# Patient Record
Sex: Female | Born: 1970 | Race: White | Hispanic: No | Marital: Married | Smoking: Never smoker
Health system: Southern US, Community
[De-identification: ages and names within clinical notes are randomized; demographics above are authoritative.]

## PROBLEM LIST (undated history)

## (undated) DIAGNOSIS — C4491 Basal cell carcinoma of skin, unspecified: Secondary | ICD-10-CM

## (undated) DIAGNOSIS — U071 COVID-19: Secondary | ICD-10-CM

## (undated) HISTORY — PX: ROTATOR CUFF REPAIR: SHX139

## (undated) HISTORY — DX: Basal cell carcinoma of skin, unspecified: C44.91

## (undated) HISTORY — DX: COVID-19: U07.1

---

## 2005-01-08 ENCOUNTER — Observation Stay: Payer: Self-pay | Admitting: Obstetrics & Gynecology

## 2005-02-01 ENCOUNTER — Inpatient Hospital Stay: Payer: Self-pay | Admitting: Obstetrics & Gynecology

## 2005-05-27 ENCOUNTER — Inpatient Hospital Stay: Payer: Self-pay | Admitting: Internal Medicine

## 2006-12-24 ENCOUNTER — Encounter: Payer: Self-pay | Admitting: Otolaryngology

## 2007-01-04 ENCOUNTER — Encounter: Payer: Self-pay | Admitting: Otolaryngology

## 2007-02-04 ENCOUNTER — Encounter: Payer: Self-pay | Admitting: Otolaryngology

## 2010-01-04 ENCOUNTER — Ambulatory Visit: Payer: Self-pay | Admitting: Obstetrics & Gynecology

## 2013-06-08 ENCOUNTER — Ambulatory Visit: Payer: Self-pay | Admitting: Obstetrics & Gynecology

## 2013-06-08 LAB — CBC
HCT: 30.1 % — ABNORMAL LOW (ref 35.0–47.0)
HGB: 10 g/dL — ABNORMAL LOW (ref 12.0–16.0)
MCH: 25.2 pg — ABNORMAL LOW (ref 26.0–34.0)
MCV: 76 fL — ABNORMAL LOW (ref 80–100)
Platelet: 510 10*3/uL — ABNORMAL HIGH (ref 150–440)
WBC: 6.2 10*3/uL (ref 3.6–11.0)

## 2013-06-08 LAB — PREGNANCY, URINE: Pregnancy Test, Urine: NEGATIVE m[IU]/mL

## 2013-06-11 ENCOUNTER — Ambulatory Visit: Payer: Self-pay | Admitting: Obstetrics & Gynecology

## 2013-06-11 HISTORY — PX: DILATION AND CURETTAGE OF UTERUS: SHX78

## 2013-06-12 LAB — PATHOLOGY REPORT

## 2014-02-21 ENCOUNTER — Observation Stay: Payer: Self-pay | Admitting: Obstetrics and Gynecology

## 2014-02-21 LAB — CBC WITH DIFFERENTIAL/PLATELET
BASOS PCT: 0.4 %
Basophil #: 0 10*3/uL (ref 0.0–0.1)
Eosinophil #: 0.2 10*3/uL (ref 0.0–0.7)
Eosinophil %: 1.8 %
HCT: 21.7 % — ABNORMAL LOW (ref 35.0–47.0)
HGB: 6.8 g/dL — AB (ref 12.0–16.0)
LYMPHS ABS: 2.9 10*3/uL (ref 1.0–3.6)
Lymphocyte %: 27.7 %
MCH: 25.6 pg — ABNORMAL LOW (ref 26.0–34.0)
MCHC: 31.4 g/dL — ABNORMAL LOW (ref 32.0–36.0)
MCV: 82 fL (ref 80–100)
Monocyte #: 0.7 x10 3/mm (ref 0.2–0.9)
Monocyte %: 6.6 %
NEUTROS PCT: 63.5 %
Neutrophil #: 6.6 10*3/uL — ABNORMAL HIGH (ref 1.4–6.5)
Platelet: 371 10*3/uL (ref 150–440)
RBC: 2.66 10*6/uL — AB (ref 3.80–5.20)
RDW: 17.5 % — AB (ref 11.5–14.5)
WBC: 10.4 10*3/uL (ref 3.6–11.0)

## 2014-02-21 LAB — URINALYSIS, COMPLETE
Bacteria: NONE SEEN
Bilirubin,UR: NEGATIVE
Glucose,UR: NEGATIVE mg/dL (ref 0–75)
Ketone: NEGATIVE
Leukocyte Esterase: NEGATIVE
Nitrite: NEGATIVE
PH: 5 (ref 4.5–8.0)
Specific Gravity: 1.015 (ref 1.003–1.030)
Squamous Epithelial: 1

## 2014-02-21 LAB — CBC
HCT: 16 % — AB (ref 35.0–47.0)
HGB: 5.1 g/dL — AB (ref 12.0–16.0)
MCH: 24.9 pg — AB (ref 26.0–34.0)
MCHC: 31.9 g/dL — AB (ref 32.0–36.0)
MCV: 78 fL — AB (ref 80–100)
Platelet: 424 10*3/uL (ref 150–440)
RBC: 2.05 10*6/uL — ABNORMAL LOW (ref 3.80–5.20)
RDW: 18.2 % — ABNORMAL HIGH (ref 11.5–14.5)
WBC: 11.8 10*3/uL — AB (ref 3.6–11.0)

## 2014-02-21 LAB — BASIC METABOLIC PANEL
Anion Gap: 8 (ref 7–16)
BUN: 10 mg/dL (ref 7–18)
CALCIUM: 8.5 mg/dL (ref 8.5–10.1)
CHLORIDE: 104 mmol/L (ref 98–107)
Co2: 24 mmol/L (ref 21–32)
Creatinine: 0.78 mg/dL (ref 0.60–1.30)
EGFR (African American): >60
EGFR (Non-African Amer.): >60
GLUCOSE: 114 mg/dL — AB (ref 65–99)
OSMOLALITY: 272 (ref 275–301)
POTASSIUM: 3.8 mmol/L (ref 3.5–5.1)
Sodium: 136 mmol/L (ref 136–145)

## 2014-02-21 LAB — PROTIME-INR
INR: 0.9
Prothrombin Time: 12.3 secs (ref 11.5–14.7)

## 2014-02-21 LAB — TSH: Thyroid Stimulating Horm: 3.72 u[IU]/mL

## 2014-02-21 LAB — HCG, QUANTITATIVE, PREGNANCY: Beta Hcg, Quant.: <1 m[IU]/mL — ABNORMAL LOW

## 2014-02-22 LAB — CBC WITH DIFFERENTIAL/PLATELET
BASOS PCT: 0.4 %
Basophil #: 0 10*3/uL (ref 0.0–0.1)
EOS ABS: 0.2 10*3/uL (ref 0.0–0.7)
EOS PCT: 2.5 %
HCT: 22.1 % — AB (ref 35.0–47.0)
HGB: 7.1 g/dL — AB (ref 12.0–16.0)
LYMPHS ABS: 2.8 10*3/uL (ref 1.0–3.6)
Lymphocyte %: 31.7 %
MCH: 25.7 pg — AB (ref 26.0–34.0)
MCHC: 32 g/dL (ref 32.0–36.0)
MCV: 80 fL (ref 80–100)
MONO ABS: 0.7 x10 3/mm (ref 0.2–0.9)
Monocyte %: 7.5 %
NEUTROS PCT: 57.9 %
Neutrophil #: 5.2 10*3/uL (ref 1.4–6.5)
Platelet: 342 10*3/uL (ref 150–440)
RBC: 2.75 10*6/uL — ABNORMAL LOW (ref 3.80–5.20)
RDW: 17.1 % — ABNORMAL HIGH (ref 11.5–14.5)
WBC: 8.9 10*3/uL (ref 3.6–11.0)

## 2014-02-24 HISTORY — PX: INTRAUTERINE DEVICE (IUD) INSERTION: SHX5877

## 2015-02-25 NOTE — Op Note (Signed)
PATIENT NAME:  Mary May, Mary May MR#:  [id number] DATE OF BIRTH:  Nov 13, 1970  DATE OF PROCEDURE:  06/11/2013  PREOPERATIVE DIAGNOSIS: Menorrhagia and abnormal uterine bleeding.   POSTOPERATIVE DIAGNOSIS: Menorrhagia and abnormal uterine bleeding.   PROCEDURE PERFORMED: Fractional dilatation and curettage.   SURGEON: Barnett Applebaum, M.D.   ANESTHESIA: General.   ESTIMATED BLOOD LOSS: Minimal.   COMPLICATIONS: None.   SPECIMEN: Endocervical curettage and endometrial curettage.   DISPOSITION: To the recovery room in stable condition.   TECHNIQUE: The patient is prepped and draped in the usual sterile fashion after adequate anesthesia is obtained in the dorsal lithotomy position. The bladder is drained with a Robinson catheter. Speculum is placed and the cervix is identified and grasped with a tenaculum. The endocervical curettage is performed with a Kevorkian curette. The cervix is then dilated to a size 20 Pratt dilator and the uterus is sounded to 10 cm. The uterus then has a curettage using a banjo curette with a specimen retrieved and sent to pathology for further review. Excellent hemostasis is noted. The tenaculum is removed. The patient is cleansed all blood and Betadine and goes to the recovery room in stable condition. All sponge, instrument, and needle counts are correct.   ____________________________ R. Barnett Applebaum, MD rph:aw D: 06/11/2013 11:38:53 ET T: 06/11/2013 11:48:44 ET JOB#: [id number]  cc: Glean Salen, MD, <Dictator> Gae Dry MD ELECTRONICALLY SIGNED 06/11/2013 19:02

## 2015-02-26 NOTE — Consult Note (Signed)
Consulting Service: Emergency Department Consulting Physician: Hinda Kehr MD History of Present Illness:  44 year old [phone] with long standing history of menorrhagia initially managed with D&C in 06/11/2013 with resumption of normal menses. This most recent episode of bleeding began 5 weeks, initially light flow about like a regular period for first 2 weeks, took 10 days of po porvera with brief cessation in bleeding, then resumption of heavier bleeding with clots about 1 super tampon every 1-1.5hrs.  She was evaluated by Dr. Kenton Kingfisher on 02/18/2014, transvaginal ultrasound at that time normal findings with and EMS of 32mm.  She was started on LoLoestrin, she is taking this one pill po daily (i.e. not tapper).  She started feeling light headed, dizzy on 02/19/14 but her daughter was hospitalized after undergoing ACL repair recently so she put off coming in.  patient states her menses have been increasing in flow for the past 2-3 years.  Prior to this she reports fairly light to moderate flow without passage of clots.  She did not have excessive bleeding following her deliveries, never required a blood transfusion, denies easy bruising or  easy bleeding during dental procedures.  She does not recall ever having her thyroid checked, she does report approximately 20lbs weight gain in the last year.  Denies constipation, skin or hair changes.  Review of Systems: 10 point of review of systems Past Medical History: 1) Menorrhagia 2) Obesity Past Surgical History: 1) C-section x 2 (2002 and 2006) 2) D&C x 2 (last 06/11/2013 [facility] benign pathology showing proliferative type endometrium) Past Obstetric History: [id number], SAB x 1, ectopic x 1TSVD 7lbs female1LTCS 7lbs 3oz femaleRLTCS 7lbs 3oz female Past Gynecologic History:  Menarche age 72, last pap 01/05/2012 negative for intraepithelial lesion and malignancy as well as HPV, GC & CT negative & negative. Family History: non-contributory Social History:  denies  tobacco, EtOH, illicit drug use Allergies: NKDA 1) Loloestrin FE 1 tab po once dailyb Physical Exam: T 97.50F; BP 102/51; HR 113; RR 18; O2sat 182% RANADnormocephalic, anicteric, pale conjunctivaCTABRRRNABS, soft, non-tender, non-distended, no rebound, no guardingFull exam deferred secondary to normal recent transvaginal ultrasound see below. normal external female genitalia, minimal amount of bleeding on chucks pad currently.  Not particularly brisk bleeding noted by referring ER phsysician.no edema, no erythema, no tendernessaffect full, mood appropriate Laboratory: BMP 02/21/2014 00:13: Na 136; K 3.8; Cl 104; CO2 24, BUN 10; Cr 0.78, BG 114BHCG 02/21/2014 00:13: <1and Screen 02/21/2014 00:13: O positive, antibody screen negative04/19/2015 00:35: Glucose negative, bilirubin negative, ketones, negative, specific gravity 1.015, blood 3+, pH 5.0, protein 30mg /dl, nitrite negative04/19/2015 00:13: PT 12.3, INR 0.904/19/2015 00:13: WBC 11.8K, H&H 5.1 & 16.0; Platelets 424K Imaging: Transvaginal Ultrasound (02/18/2014): recent transvaginal ultrasound at [facility] showing a normal size anteverted uterus with 51mm endometrial stripe with blood clot in the endometrial lining.  Right ovary not visualized, left ovary normal. Assessment: 44 year old [id number] with menorrhagia to anemia Plan: 1) Menorrhagia to anemia ? acute blood loss anemia with Hgb of 5.1 admit for transfusion of 2 units of packed red blood cells, repeat CBC 4-hr post transfusion.  Feel most likely underlying etiology given habitus and age is anovulatory cycle.  Long term management discussed with Dr. Kenton Kingfisher in clinic include hysterectomy, endometrial ablation, also brought up option of Mirena IUD - No recent baseline CBC - Will check TSH, prolactin - Medroxyprogesterone 20mg  po TID per ACOG Committee Opinion 553 Management of Acute Abnormal uterine bleeding.  We did discuss IV estrogen if unresponsive as  well as associated risk of DVT/VTE  with high dose estrogen therapy 2) FEN ? regular diet, D5  NS at 150L/hr 3) DVT ppx ? SCD?s until H&H improved and ambulatory 4) Dispositon ? pending cessation of uterine bleeding and improvement in H&H following transfusion   Electronic Signatures: Dorthula Nettles (MD)  (Signed on 19-Apr-15 03:09)  Authored  Last Updated: 19-Apr-15 03:09 by Dorthula Nettles (MD)

## 2016-10-02 ENCOUNTER — Other Ambulatory Visit: Payer: Self-pay | Admitting: Obstetrics & Gynecology

## 2016-10-02 DIAGNOSIS — Z1231 Encounter for screening mammogram for malignant neoplasm of breast: Secondary | ICD-10-CM

## 2016-10-12 ENCOUNTER — Ambulatory Visit
Admission: RE | Admit: 2016-10-12 | Discharge: 2016-10-12 | Disposition: A | Discharge status: Home / Self Care | Payer: Self-pay | Source: Ambulatory Visit | Attending: Obstetrics & Gynecology | Admitting: Obstetrics & Gynecology

## 2016-10-12 DIAGNOSIS — Z1231 Encounter for screening mammogram for malignant neoplasm of breast: Secondary | ICD-10-CM | POA: Insufficient documentation

## 2017-10-07 ENCOUNTER — Other Ambulatory Visit: Payer: Self-pay | Admitting: Obstetrics & Gynecology

## 2017-10-18 ENCOUNTER — Encounter: Payer: Self-pay | Admitting: Obstetrics and Gynecology

## 2017-10-18 ENCOUNTER — Ambulatory Visit (INDEPENDENT_AMBULATORY_CARE_PROVIDER_SITE_OTHER): Payer: Managed Care, Other (non HMO) | Admitting: Obstetrics and Gynecology

## 2017-10-18 VITALS — BP 134/92 | HR 69 | Ht 68.0 in | Wt 251.0 lb

## 2017-10-18 DIAGNOSIS — Z6838 Body mass index (BMI) 38.0-38.9, adult: Secondary | ICD-10-CM

## 2017-10-18 DIAGNOSIS — Z13220 Encounter for screening for lipoid disorders: Secondary | ICD-10-CM | POA: Diagnosis not present

## 2017-10-18 DIAGNOSIS — Z0000 Encounter for general adult medical examination without abnormal findings: Secondary | ICD-10-CM

## 2017-10-18 DIAGNOSIS — Z1239 Encounter for other screening for malignant neoplasm of breast: Secondary | ICD-10-CM

## 2017-10-18 DIAGNOSIS — Z1211 Encounter for screening for malignant neoplasm of colon: Secondary | ICD-10-CM | POA: Diagnosis not present

## 2017-10-18 DIAGNOSIS — Z01419 Encounter for gynecological examination (general) (routine) without abnormal findings: Secondary | ICD-10-CM | POA: Diagnosis not present

## 2017-10-18 DIAGNOSIS — Z1321 Encounter for screening for nutritional disorder: Secondary | ICD-10-CM | POA: Diagnosis not present

## 2017-10-18 DIAGNOSIS — Z1329 Encounter for screening for other suspected endocrine disorder: Secondary | ICD-10-CM

## 2017-10-18 DIAGNOSIS — Z1231 Encounter for screening mammogram for malignant neoplasm of breast: Secondary | ICD-10-CM | POA: Diagnosis not present

## 2017-10-18 NOTE — Progress Notes (Signed)
Gynecology Annual Exam  PCP: Patient, No Pcp Per  Chief Complaint:  Chief Complaint  Patient presents with  . Gynecologic Exam    History of Present Illness: Patient is a 46 y.o. [id number] presents for annual exam. The patient has no complaints today.   LMP: No LMP recorded. Patient is not currently having periods (Reason: IUD). Intermenstrual Bleeding: no Postcoital Bleeding: no Dysmenorrhea: no  IUD no bleeding, in place for 4 years  mammogram 2017 Birads 1 Pap 2017 NIL, hpv negative Declines colonoscopy Symptoms of menopause, sleep disturbances,   The patient is sexually active. She currently uses IUD for contraception. She denies dyspareunia.  The patient does not perform self breast exams.  There is no notable family history of breast or ovarian cancer in her family.  The patient wears seatbelts: yes.   The patient has regular exercise: yes.    The patient denies current symptoms of depression.    Review of Systems: Review of Systems  Constitutional: Negative for chills, fever, malaise/fatigue and weight loss.  HENT: Negative for congestion, hearing loss and sinus pain.   Eyes: Negative for blurred vision and double vision.  Respiratory: Negative for cough, sputum production, shortness of breath and wheezing.   Cardiovascular: Negative for chest pain, palpitations, orthopnea and leg swelling.  Gastrointestinal: Negative for abdominal pain, constipation, diarrhea, nausea and vomiting.  Genitourinary: Negative for dysuria, flank pain, frequency, hematuria and urgency.  Musculoskeletal: Negative for back pain, falls and joint pain.  Skin: Negative for itching and rash.  Neurological: Negative for dizziness and headaches.  Psychiatric/Behavioral: Negative for depression, substance abuse and suicidal ideas. The patient is not nervous/anxious.     Past Medical History:  Past Medical History:  Diagnosis Date  . Basal cell carcinoma    shoulder, hip, stomach, left arm,  back of right leg    Past Surgical History:  Past Surgical History:  Procedure Laterality Date  . CESAREAN SECTION    . DILATION AND CURETTAGE OF UTERUS  06/11/2013  . INTRAUTERINE DEVICE (IUD) INSERTION  02/24/2014   mirena  . ROTATOR CUFF REPAIR      Gynecologic History:  No LMP recorded. Patient is not currently having periods (Reason: IUD). Contraception: IUD Last Pap: Results were: 10/02/2016 NIL and HR HPV negative  Last mammogram: 10/12/2016  Results were: Gillian Shields I Obstetric History: [id number]  Family History:  Family History  Problem Relation Age of Onset  . Breast cancer Maternal Aunt   . Leukemia Father   . Brain cancer Paternal Aunt   . Lung cancer Paternal Aunt   . Bone cancer Paternal Aunt   . Colon cancer Paternal Uncle   . Prostate cancer Paternal Uncle   . Heart attack Paternal Uncle   . Colon cancer Maternal Grandfather     Social History:  Social History   Socioeconomic History  . Marital status: Married    Spouse name: Not on file  . Number of children: Not on file  . Years of education: Not on file  . Highest education level: Not on file  Social Needs  . Financial resource strain: Not on file  . Food insecurity - worry: Not on file  . Food insecurity - inability: Not on file  . Transportation needs - medical: Not on file  . Transportation needs - non-medical: Not on file  Occupational History  . Not on file  Tobacco Use  . Smoking status: Never Smoker  . Smokeless tobacco: Never Used  Substance and Sexual  Activity  . Alcohol use: No    Frequency: Never  . Drug use: No  . Sexual activity: Yes    Birth control/protection: IUD  Other Topics Concern  . Not on file  Social History Narrative  . Not on file    Allergies:  No Known Allergies  Medications: Prior to Admission medications   Medication Sig Start Date End Date Taking? Authorizing Provider  levonorgestrel (MIRENA) 20 MCG/24HR IUD 1 each by Intrauterine route once.   Yes  [provider]    Physical Exam Vitals: Blood pressure (!) 134/92, pulse 69, height 5\' 8"  (1.727 m), weight 251 lb (113.9 kg).  Physical Exam  Constitutional: She is oriented to person, place, and time. She appears well-developed.  Genitourinary: Vagina normal and uterus normal. There is no lesion on the right labia. There is no lesion on the left labia. Vagina exhibits no lesion. Right adnexum does not display mass. Left adnexum does not display mass. Cervix does not exhibit motion tenderness.  HENT:  Head: Normocephalic and atraumatic.  Eyes: EOM are normal.  Neck: Neck supple. No thyromegaly present.  Cardiovascular: Normal rate, regular rhythm and normal heart sounds.  Pulmonary/Chest: Effort normal and breath sounds normal. Right breast exhibits no inverted nipple, no mass, no nipple discharge and no skin change. Left breast exhibits no inverted nipple, no mass, no nipple discharge and no skin change.  Abdominal: Soft. Bowel sounds are normal. She exhibits no distension and no mass.  Neurological: She is alert and oriented to person, place, and time.  Skin: Skin is warm and dry.  Psychiatric: She has a normal mood and affect. Her behavior is normal. Judgment and thought content normal.  Vitals reviewed.  Female chaperone present for pelvic and breast  portions of the physical exam  Assessment: 46 y.o. [id number] routine annual exam  Plan: Problem List Items Addressed This Visit    None    Visit Diagnoses    Health care maintenance    -  Primary   Relevant Orders   CBC With Differential   Comprehensive metabolic panel   32-DJMEQASTMHDQQI D Lcms D2+D3   Screening for breast cancer       Relevant Orders   MM DIGITAL SCREENING BILATERAL   BMI 38.0-38.9,adult       Screening for colon cancer       Screening cholesterol level       Relevant Orders   Lipid panel   Screening for thyroid disorder       Relevant Orders   TSH   Encounter for vitamin deficiency screening        Relevant Orders   25-Hydroxyvitamin D Lcms D2+D3      1) Mammogram - recommend yearly screening mammogram.  Mammogram Was ordered today  2) STI screening was offered and declined  3) ASCCP guidelines and rational discussed.  Patient opts for every 5 years screening interval  4) Contraception - continue with IUD  5) Colonoscopy -- Screening recommended starting at age 57 for average risk individuals, age 23 for individuals deemed at increased risk (including African Americans) and recommended to continue until age 68.  For patient age 35-85 individualized approach is recommended.  Gold standard screening is via colonoscopy, Cologuard screening is an acceptable alternative for patient unwilling or unable to undergo colonoscopy.  "Colorectal cancer screening for average?risk adults: 2018 guideline update from the American Cancer Society"[place]: A Cancer Journal for Clinicians: Apr 03, 2017   6) Routine healthcare maintenance including cholesterol, diabetes  screening discussed. Ordered today.

## 2017-10-22 LAB — COMPREHENSIVE METABOLIC PANEL
A/G RATIO: 2.1 (ref 1.2–2.2)
ALT: 13 IU/L (ref 0–32)
AST: 17 IU/L (ref 0–40)
Albumin: 4.6 g/dL (ref 3.5–5.5)
Alkaline Phosphatase: 91 IU/L (ref 39–117)
BUN/Creatinine Ratio: 15 (ref 9–23)
BUN: 11 mg/dL (ref 6–24)
Bilirubin Total: 0.4 mg/dL (ref 0.0–1.2)
CALCIUM: 9.4 mg/dL (ref 8.7–10.2)
CO2: 24 mmol/L (ref 20–29)
CREATININE: 0.75 mg/dL (ref 0.57–1.00)
Chloride: 101 mmol/L (ref 96–106)
GFR calc Af Amer: 109 mL/min/{1.73_m2} (ref >59)
GFR, EST NON AFRICAN AMERICAN: 95 mL/min/{1.73_m2} (ref >59)
GLOBULIN, TOTAL: 2.2 g/dL (ref 1.5–4.5)
Glucose: 92 mg/dL (ref 65–99)
Potassium: 4.4 mmol/L (ref 3.5–5.2)
Sodium: 137 mmol/L (ref 134–144)
TOTAL PROTEIN: 6.8 g/dL (ref 6.0–8.5)

## 2017-10-22 LAB — CBC WITH DIFFERENTIAL
Basophils Absolute: 0 10*3/uL (ref 0.0–0.2)
Basos: 0 %
EOS (ABSOLUTE): 0.2 10*3/uL (ref 0.0–0.4)
EOS: 3 %
HEMATOCRIT: 43.4 % (ref 34.0–46.6)
Hemoglobin: 14.4 g/dL (ref 11.1–15.9)
Immature Grans (Abs): 0 10*3/uL (ref 0.0–0.1)
Immature Granulocytes: 0 %
LYMPHS: 30 %
Lymphocytes Absolute: 2.2 10*3/uL (ref 0.7–3.1)
MCH: 29.3 pg (ref 26.6–33.0)
MCHC: 33.2 g/dL (ref 31.5–35.7)
MCV: 88 fL (ref 79–97)
MONOCYTES: 9 %
MONOS ABS: 0.6 10*3/uL (ref 0.1–0.9)
Neutrophils Absolute: 4.3 10*3/uL (ref 1.4–7.0)
Neutrophils: 58 %
RBC: 4.91 x10E6/uL (ref 3.77–5.28)
RDW: 13.5 % (ref 12.3–15.4)
WBC: 7.3 10*3/uL (ref 3.4–10.8)

## 2017-10-22 LAB — LIPID PANEL
Chol/HDL Ratio: 4.3 ratio (ref 0.0–4.4)
Cholesterol, Total: 220 mg/dL — ABNORMAL HIGH (ref 100–199)
HDL: 51 mg/dL (ref >39)
LDL CALC: 150 mg/dL — AB (ref 0–99)
TRIGLYCERIDES: 95 mg/dL (ref 0–149)
VLDL Cholesterol Cal: 19 mg/dL (ref 5–40)

## 2017-10-22 LAB — 25-HYDROXY VITAMIN D LCMS D2+D3
25-Hydroxy, Vitamin D-2: <1 ng/mL
25-Hydroxy, Vitamin D-3: 23 ng/mL
25-Hydroxy, Vitamin D: 24 ng/mL — ABNORMAL LOW

## 2017-10-22 LAB — TSH: TSH: 3.58 u[IU]/mL (ref 0.450–4.500)

## 2017-10-22 NOTE — Progress Notes (Signed)
Discussed with patient on phone recommended vitamin D supplementation and seeing her primary care doctor to manage cholesterol.

## 2017-12-04 ENCOUNTER — Ambulatory Visit
Admission: RE | Admit: 2017-12-04 | Discharge: 2017-12-04 | Disposition: A | Discharge status: Home / Self Care | Payer: Managed Care, Other (non HMO) | Source: Ambulatory Visit | Attending: Obstetrics and Gynecology | Admitting: Obstetrics and Gynecology

## 2017-12-04 DIAGNOSIS — Z1239 Encounter for other screening for malignant neoplasm of breast: Secondary | ICD-10-CM

## 2017-12-04 DIAGNOSIS — Z1231 Encounter for screening mammogram for malignant neoplasm of breast: Secondary | ICD-10-CM | POA: Insufficient documentation

## 2017-12-09 NOTE — Progress Notes (Signed)
Called and left message to check Mychart.

## 2017-12-10 DIAGNOSIS — M25561 Pain in right knee: Secondary | ICD-10-CM | POA: Insufficient documentation

## 2018-11-13 ENCOUNTER — Ambulatory Visit (INDEPENDENT_AMBULATORY_CARE_PROVIDER_SITE_OTHER): Payer: 59 | Admitting: Obstetrics & Gynecology

## 2018-11-13 ENCOUNTER — Encounter: Payer: Self-pay | Admitting: Obstetrics & Gynecology

## 2018-11-13 VITALS — BP 120/80 | Ht 68.0 in | Wt 231.0 lb

## 2018-11-13 DIAGNOSIS — Z1239 Encounter for other screening for malignant neoplasm of breast: Secondary | ICD-10-CM

## 2018-11-13 DIAGNOSIS — Z01419 Encounter for gynecological examination (general) (routine) without abnormal findings: Secondary | ICD-10-CM

## 2018-11-13 DIAGNOSIS — E559 Vitamin D deficiency, unspecified: Secondary | ICD-10-CM

## 2018-11-13 NOTE — Patient Instructions (Addendum)
PAP every three years Mammogram every year    Call [phone] to schedule at [facility] Colonoscopy every 10 years after age 50 Labs up to date  IUD Exchange soon    Take Ibuprofen 800 mg prior to exam

## 2018-11-13 NOTE — Progress Notes (Signed)
HPI:      Mary May is a 48 y.o. [id number] who LMP was No LMP recorded. (Menstrual status: IUD)., she presents today for her annual examination. The patient has no complaints today. The patient is sexually active. Her last pap: approximate date 2017 and was normal and last mammogram: approximate date 2018 and was normal. The patient does perform self breast exams.  There is no notable family history of breast or ovarian cancer in her family.  The patient has regular exercise: yes.  The patient denies current symptoms of depression.    GYN History: Contraception: IUD  PMHx: Past Medical History:  Diagnosis Date  . Basal cell carcinoma    shoulder, hip, stomach, left arm, back of right leg   Past Surgical History:  Procedure Laterality Date  . CESAREAN SECTION    . DILATION AND CURETTAGE OF UTERUS  06/11/2013  . INTRAUTERINE DEVICE (IUD) INSERTION  02/24/2014   mirena  . ROTATOR CUFF REPAIR     Family History  Problem Relation Age of Onset  . Breast cancer Maternal Aunt   . Leukemia Father   . Brain cancer Paternal Aunt   . Lung cancer Paternal Aunt   . Bone cancer Paternal Aunt   . Colon cancer Paternal Uncle   . Prostate cancer Paternal Uncle   . Heart attack Paternal Uncle   . Colon cancer Maternal Grandfather    Social History   Tobacco Use  . Smoking status: Never Smoker  . Smokeless tobacco: Never Used  Substance Use Topics  . Alcohol use: No    Frequency: Never  . Drug use: No    Current Outpatient Medications:  .  levonorgestrel (MIRENA) 20 MCG/24HR IUD, 1 each by Intrauterine route once., Disp: , Rfl:  Allergies: Patient has no known allergies.  Review of Systems  Constitutional: Negative for chills, fever and malaise/fatigue.  HENT: Negative for congestion, sinus pain and sore throat.   Eyes: Negative for blurred vision and pain.  Respiratory: Negative for cough and wheezing.   Cardiovascular: Negative for chest pain and leg swelling.    Gastrointestinal: Negative for abdominal pain, constipation, diarrhea, heartburn, nausea and vomiting.  Genitourinary: Negative for dysuria, frequency, hematuria and urgency.  Musculoskeletal: Negative for back pain, joint pain, myalgias and neck pain.  Skin: Negative for itching and rash.  Neurological: Negative for dizziness, tremors and weakness.  Endo/Heme/Allergies: Does not bruise/bleed easily.  Psychiatric/Behavioral: Negative for depression. The patient is not nervous/anxious and does not have insomnia.     Objective: BP 120/80   Ht 5\' 8"  (1.727 m)   Wt 231 lb (104.8 kg)   BMI 35.12 kg/m   Filed Weights   11/13/18 1522  Weight: 231 lb (104.8 kg)   Body mass index is 35.12 kg/m. Physical Exam Constitutional:      General: She is not in acute distress.    Appearance: She is well-developed.  Genitourinary:     Pelvic exam was performed with patient supine.     Vagina, uterus and rectum normal.     No lesions in the vagina.     No vaginal bleeding.     No cervical motion tenderness, friability, lesion or polyp.     Uterus is mobile.     Uterus is not enlarged.     No uterine mass detected.    Uterus is midaxial.     No right or left adnexal mass present.     Right adnexa not tender.  Left adnexa not tender.     Genitourinary Comments: IUD strings 2 cm  HENT:     Head: Normocephalic and atraumatic. No laceration.     Right Ear: Hearing normal.     Left Ear: Hearing normal.     Mouth/Throat:     Pharynx: Uvula midline.  Eyes:     Pupils: Pupils are equal, round, and reactive to light.  Neck:     Musculoskeletal: Normal range of motion and neck supple.     Thyroid: No thyromegaly.  Cardiovascular:     Rate and Rhythm: Normal rate and regular rhythm.     Heart sounds: No murmur. No friction rub. No gallop.   Pulmonary:     Effort: Pulmonary effort is normal. No respiratory distress.     Breath sounds: Normal breath sounds. No wheezing.  Chest:     Breasts:         Right: No mass, skin change or tenderness.        Left: No mass, skin change or tenderness.  Abdominal:     General: Bowel sounds are normal. There is no distension.     Palpations: Abdomen is soft.     Tenderness: There is no abdominal tenderness. There is no rebound.  Musculoskeletal: Normal range of motion.  Neurological:     Mental Status: She is alert and oriented to person, place, and time.     Cranial Nerves: No cranial nerve deficit.  Skin:    General: Skin is warm and dry.  Psychiatric:        Judgment: Judgment normal.  Vitals signs reviewed.     Assessment:  ANNUAL EXAM 1. Screening for breast cancer   2. Women's annual routine gynecological examination   3. Vitamin D deficiency    Screening Plan:            1.  Cervical Screening-  Pap smear schedule reviewed with patient, due 2021  2. Breast screening- Exam annually and mammogram>40 planned   3. Colonoscopy every 10 years, Hemoccult testing - after age 3, due next year  4. Labs Ordered today  5. Counseling for contraception: IUD  IUD exchange soon Counseled on pros and cons of IUD at 46; as she has done well and has no bleeding and is concerned about irreg bleeding and perimenopausal sx's, will cont w IUD another 5 year cycle  6. Vitamin D deficiency - VITAMIN D 25 Hydroxy (Vit-D Deficiency, Fractures)      F/U  Return in about 2 weeks (around 11/27/2018) for Follow up for IUD exchange anytime pt desires.  Barnett Applebaum, MD, [facility], [facility] 11/13/2018  3:58 PM

## 2018-11-14 LAB — VITAMIN D 25 HYDROXY (VIT D DEFICIENCY, FRACTURES): Vit D, 25-Hydroxy: 22.9 ng/mL — ABNORMAL LOW (ref 30.0–100.0)

## 2018-11-25 ENCOUNTER — Encounter: Payer: Self-pay | Admitting: Obstetrics & Gynecology

## 2018-11-25 ENCOUNTER — Ambulatory Visit (INDEPENDENT_AMBULATORY_CARE_PROVIDER_SITE_OTHER): Payer: 59 | Admitting: Obstetrics & Gynecology

## 2018-11-25 VITALS — BP 120/80 | Ht 68.5 in | Wt 232.0 lb

## 2018-11-25 DIAGNOSIS — Z30433 Encounter for removal and reinsertion of intrauterine contraceptive device: Secondary | ICD-10-CM

## 2018-11-25 NOTE — Progress Notes (Signed)
  History of Present Illness:  Mary May is a 48 y.o. that had a Mirena IUD placed approximately 5 years ago. Since that time, she states that she does well w no periods and great contraception.  The following portions of the patient's history were reviewed and updated as appropriate: allergies, current medications, past family history, past medical history, past social history, past surgical history and problem list.  There are no active problems to display for this patient.  Medications:  Current Outpatient Medications on File Prior to Visit  Medication Sig Dispense Refill  . levonorgestrel (MIRENA) 20 MCG/24HR IUD 1 each by Intrauterine route once.     No current facility-administered medications on file prior to visit.    Allergies: has No Known Allergies.  Physical Exam:  BP 120/80   Ht 5' 8.5" (1.74 m)   Wt 232 lb (105.2 kg)   BMI 34.76 kg/m  Body mass index is 34.76 kg/m. Constitutional: Well nourished, well developed female in no acute distress.  Abdomen: diffusely non tender to palpation, non distended, and no masses, hernias Neuro: Grossly intact Psych:  Normal mood and affect.    Pelvic exam:  Two IUD strings present seen coming from the cervical os. EGBUS, vaginal vault and cervix: within normal limits  IUD Removal Strings of IUD identified and grasped.  IUD removed without problem.  Pt tolerated this well.  IUD noted to be intact.  IUD Re-Insertion PROCEDURE NOTE:  Mary May is a 48 y.o. [id number] here for IUD insertion. No GYN concerns.  Last pap smear was normal.  IUD Insertion Procedure Note Patient identified, informed consent performed, consent signed.   Discussed risks of irregular bleeding, cramping, infection, malpositioning or misplacement of the IUD outside the uterus which may require further procedure such as laparoscopy, risk of failure <1%. Time out was performed.  Urine pregnancy test negative.  A bimanual exam showed the uterus to be  anteverted.  Speculum placed in the vagina.  Cervix visualized.  Cleaned with Betadine x 2.  Grasped anteriorly with a single tooth tenaculum.  Uterus sounded to 7 cm.   IUD placed per manufacturer's recommendations.  Strings trimmed to 3 cm. Tenaculum was removed, good hemostasis noted.  Patient tolerated procedure well.   Patient was given post-procedure instructions.  She was advised to have backup contraception for one week.  Patient was also asked to check IUD strings periodically and follow up in 4 weeks for IUD check.  Barnett Applebaum, MD, [facility], [facility] 11/25/2018  10:39 AM

## 2018-11-25 NOTE — Patient Instructions (Signed)
Intrauterine Device Insertion, Care After    This sheet gives you information about how to care for yourself after your procedure. Your health care provider may also give you more specific instructions. If you have problems or questions, contact your health care provider.  What can I expect after the procedure?  After the procedure, it is common to have:  · Cramps and pain in the abdomen.  · Light bleeding (spotting) or heavier bleeding that is like your menstrual period. This may last for up to a few days.  · Lower back pain.  · Dizziness.  · Headaches.  · Nausea.  Follow these instructions at home:  · Before resuming sexual activity, check to make sure that you can feel the IUD string(s). You should be able to feel the end of the string(s) below the opening of your cervix. If your IUD string is in place, you may resume sexual activity.  ? If you had a hormonal IUD inserted more than 7 days after your most recent period started, you will need to use a backup method of birth control for 7 days after IUD insertion. Ask your health care provider whether this applies to you.  · Continue to check that the IUD is still in place by feeling for the string(s) after every menstrual period, or once a month.  · Take over-the-counter and prescription medicines only as told by your health care provider.  · Do not drive or use heavy machinery while taking prescription pain medicine.  · Keep all follow-up visits as told by your health care provider. This is important.  Contact a health care provider if:  · You have bleeding that is heavier or lasts longer than a normal menstrual cycle.  · You have a fever.  · You have cramps or abdominal pain that get worse or do not get better with medicine.  · You develop abdominal pain that is new or is not in the same area of earlier cramping and pain.  · You feel lightheaded or weak.  · You have abnormal or bad-smelling discharge from your vagina.  · You have pain during sexual  activity.  · You have any of the following problems with your IUD string(s):  ? The string bothers or hurts you or your sexual partner.  ? You cannot feel the string.  ? The string has gotten longer.  · You can feel the IUD in your vagina.  · You think you may be pregnant, or you miss your menstrual period.  · You think you may have an STI (sexually transmitted infection).  Get help right away if:  · You have flu-like symptoms.  · You have a fever and chills.  · You can feel that your IUD has slipped out of place.  Summary  · After the procedure, it is common to have cramps and pain in the abdomen. It is also common to have light bleeding (spotting) or heavier bleeding that is like your menstrual period.  · Continue to check that the IUD is still in place by feeling for the string(s) after every menstrual period, or once a month.  · Keep all follow-up visits as told by your health care provider. This is important.  · Contact your health care provider if you have problems with your IUD string(s), such as the string getting longer or bothering you or your sexual partner.  This information is not intended to replace advice given to you by your health care provider. Make   sure you discuss any questions you have with your health care provider.  Document Released: 06/20/2011 Document Revised: 09/12/2016 Document Reviewed: 09/12/2016  Elsevier Interactive Patient Education © 2019 [employer]

## 2018-12-24 ENCOUNTER — Ambulatory Visit (INDEPENDENT_AMBULATORY_CARE_PROVIDER_SITE_OTHER): Payer: 59 | Admitting: Obstetrics & Gynecology

## 2018-12-24 ENCOUNTER — Encounter: Payer: Self-pay | Admitting: Obstetrics & Gynecology

## 2018-12-24 VITALS — BP 130/80 | Ht 69.0 in | Wt 232.0 lb

## 2018-12-24 DIAGNOSIS — E669 Obesity, unspecified: Secondary | ICD-10-CM | POA: Diagnosis not present

## 2018-12-24 DIAGNOSIS — Z30431 Encounter for routine checking of intrauterine contraceptive device: Secondary | ICD-10-CM

## 2018-12-24 MED ORDER — PHENTERMINE HCL 37.5 MG PO TABS: ORAL_TABLET | ORAL | 0 refills | Status: DC

## 2018-12-24 MED ORDER — CYANOCOBALAMIN 1000 MCG/ML IJ SOLN: 1000.0000 ug | INTRAMUSCULAR | 1 refills | Status: DC

## 2018-12-24 NOTE — Progress Notes (Signed)
  History of Present Illness:  Mary May is a 48 y.o. that had a Mirena IUD placed approximately 4 weeks ago. Since that time, she states that she has had no bleeding and pain  Also concerned for weight gain, unable to lose despite daily exercise and attempts at diet.  Has been on meds in past for obesity.  PMHx: She  has a past medical history of Basal cell carcinoma. Also,  has a past surgical history that includes Cesarean section; Dilation and curettage of uterus (06/11/2013); Intrauterine device (iud) insertion (02/24/2014); and Rotator cuff repair., family history includes Bone cancer in her paternal aunt; Brain cancer in her paternal aunt; Breast cancer in her maternal aunt; Colon cancer in her maternal grandfather and paternal uncle; Heart attack in her paternal uncle; Leukemia in her father; Lung cancer in her paternal aunt; Prostate cancer in her paternal uncle.,  reports that she has never smoked. She has never used smokeless tobacco. She reports that she does not drink alcohol or use drugs. No outpatient medications have been marked as taking for the 12/24/18 encounter (Office Visit) with Gae Dry, MD.  .  Also, has No Known Allergies..  Review of Systems  All other systems reviewed and are negative.  Physical Exam:  BP 130/80   Ht 5\' 9"  (1.753 m)   Wt 232 lb (105.2 kg)   BMI 34.26 kg/m  Body mass index is 34.26 kg/m. Constitutional: Well nourished, well developed female in no acute distress.  Abdomen: diffusely non tender to palpation, non distended, and no masses, hernias Neuro: Grossly intact Psych:  Normal mood and affect.    Pelvic exam:  Two IUD strings present seen coming from the cervical os. EGBUS, vaginal vault and cervix: within normal limits  Assessment: IUD strings present in proper location; pt doing well  Plan: She was told to continue to use barrier contraception, in order to prevent any STIs, and to take a home pregnancy test or call us if she  ever thinks she may be pregnant, and that her IUD expires in 5 years.  She was amenable to this plan and we will see her back in 1 year/PRN.  Weight loss medicine discussed, side effects.  Rx given w one month follow up.  A total of 15 minutes were spent face-to-face with the patient during this encounter and over half of that time dealt with counseling and coordination of care.  Barnett Applebaum, MD, [facility], [facility] 12/24/2018  4:25 PM

## 2019-01-20 ENCOUNTER — Other Ambulatory Visit: Payer: Self-pay

## 2019-01-20 ENCOUNTER — Ambulatory Visit
Admission: RE | Admit: 2019-01-20 | Discharge: 2019-01-20 | Disposition: A | Discharge status: Home / Self Care | Payer: 59 | Source: Ambulatory Visit | Attending: Obstetrics & Gynecology | Admitting: Obstetrics & Gynecology

## 2019-01-20 DIAGNOSIS — Z1231 Encounter for screening mammogram for malignant neoplasm of breast: Secondary | ICD-10-CM | POA: Insufficient documentation

## 2019-01-20 DIAGNOSIS — Z1239 Encounter for other screening for malignant neoplasm of breast: Secondary | ICD-10-CM

## 2019-01-21 ENCOUNTER — Other Ambulatory Visit: Payer: Self-pay | Admitting: Obstetrics & Gynecology

## 2019-01-21 ENCOUNTER — Encounter: Payer: Self-pay | Admitting: Obstetrics & Gynecology

## 2019-01-21 ENCOUNTER — Ambulatory Visit (INDEPENDENT_AMBULATORY_CARE_PROVIDER_SITE_OTHER): Payer: 59 | Admitting: Obstetrics & Gynecology

## 2019-01-21 VITALS — BP 120/80 | Ht 69.0 in | Wt 223.0 lb

## 2019-01-21 DIAGNOSIS — Z6832 Body mass index (BMI) 32.0-32.9, adult: Secondary | ICD-10-CM | POA: Diagnosis not present

## 2019-01-21 DIAGNOSIS — N6320 Unspecified lump in the left breast, unspecified quadrant: Secondary | ICD-10-CM

## 2019-01-21 DIAGNOSIS — E669 Obesity, unspecified: Secondary | ICD-10-CM | POA: Diagnosis not present

## 2019-01-21 DIAGNOSIS — R928 Other abnormal and inconclusive findings on diagnostic imaging of breast: Secondary | ICD-10-CM

## 2019-01-21 MED ORDER — PHENTERMINE HCL 37.5 MG PO TABS: ORAL_TABLET | ORAL | 1 refills | Status: DC

## 2019-01-21 NOTE — Progress Notes (Addendum)
  History of Present Illness:  Mary May is a 48 y.o. who was started on Phentermine approximately 1 month ago due to obesity/abnormal weight gain. The patient has lost 9 pounds over the past month due to meds and other lifestyle changes.  She had lost 25 lbs prior based on lifestyle changes but this has plateued prior to last month..   She has these side effects: dry mouth.  PMHx: She  has a past medical history of Basal cell carcinoma. Also,  has a past surgical history that includes Cesarean section; Dilation and curettage of uterus (06/11/2013); Intrauterine device (iud) insertion (02/24/2014); and Rotator cuff repair., family history includes Bone cancer in her paternal aunt; Brain cancer in her paternal aunt; Breast cancer in her maternal aunt; Colon cancer in her maternal grandfather and paternal uncle; Heart attack in her paternal uncle; Leukemia in her father; Lung cancer in her paternal aunt; Prostate cancer in her paternal uncle.,  reports that she has never smoked. She has never used smokeless tobacco. She reports that she does not drink alcohol or use drugs.  She has a current medication list which includes the following prescription(s): cyanocobalamin, levonorgestrel, and phentermine. Also, has No Known Allergies.  Review of Systems  All other systems reviewed and are negative.   Physical Exam:  BP 120/80   Ht 5\' 9"  (1.753 m)   Wt 223 lb (101.2 kg)   BMI 32.93 kg/m  Body mass index is 32.93 kg/m. Filed Weights   01/21/19 1557  Weight: 223 lb (101.2 kg)    Physical Exam Constitutional:      General: She is not in acute distress.    Appearance: She is well-developed.  Musculoskeletal: Normal range of motion.  Neurological:     Mental Status: She is alert and oriented to person, place, and time.  Skin:    General: Skin is warm and dry.  Vitals signs reviewed.     Assessment: obesity Medication treatment is going well for her.  Plan: Patient is continued/added  to prescription appetite suppressants: Phentermine and self-directed dieting.   Will continue to assist patient in incorporating positive experiences into her life to promote a positive mental attitude.  Education given regarding appropriate lifestyle changes for weight loss, including regular physical activity, healthy coping strategies, caloric restriction, and healthy eating patterns.  The risks and benefits as well as side effects of medication, such as Phenteramine or Tenuate, is discussed.  The pros and cons of suppressing appetite and boosting metabolism is counseled.  Risks of tolerance and addiction discussed.  Use of medicine will be short term.  Pt to call with any negative side effects and agrees to keep follow up appointments.  A total of 15 minutes were spent face-to-face with the patient during this encounter and over half of that time dealt with counseling and coordination of care.  Barnett Applebaum, MD, [facility], [facility] 01/21/2019  3:59 PM

## 2019-01-22 NOTE — Addendum Note (Signed)
Addended by: Gae Dry on: 01/22/2019 06:28 PM   Modules accepted: Level of Service

## 2019-01-28 ENCOUNTER — Ambulatory Visit
Admission: RE | Admit: 2019-01-28 | Discharge: 2019-01-28 | Disposition: A | Discharge status: Home / Self Care | Payer: 59 | Source: Ambulatory Visit | Attending: Obstetrics & Gynecology | Admitting: Obstetrics & Gynecology

## 2019-01-28 ENCOUNTER — Other Ambulatory Visit: Payer: Self-pay

## 2019-01-28 ENCOUNTER — Other Ambulatory Visit: Payer: Self-pay | Admitting: Obstetrics & Gynecology

## 2019-01-28 DIAGNOSIS — R928 Other abnormal and inconclusive findings on diagnostic imaging of breast: Secondary | ICD-10-CM | POA: Insufficient documentation

## 2019-01-28 DIAGNOSIS — N6320 Unspecified lump in the left breast, unspecified quadrant: Secondary | ICD-10-CM | POA: Insufficient documentation

## 2019-02-02 ENCOUNTER — Other Ambulatory Visit: Payer: Self-pay

## 2019-02-02 ENCOUNTER — Ambulatory Visit
Admission: RE | Admit: 2019-02-02 | Discharge: 2019-02-02 | Disposition: A | Discharge status: Home / Self Care | Payer: 59 | Source: Ambulatory Visit | Attending: Obstetrics & Gynecology | Admitting: Obstetrics & Gynecology

## 2019-02-02 DIAGNOSIS — R928 Other abnormal and inconclusive findings on diagnostic imaging of breast: Secondary | ICD-10-CM

## 2019-02-02 DIAGNOSIS — N6320 Unspecified lump in the left breast, unspecified quadrant: Secondary | ICD-10-CM

## 2019-02-02 HISTORY — PX: BREAST BIOPSY: SHX20

## 2019-02-03 LAB — SURGICAL PATHOLOGY

## 2019-03-23 ENCOUNTER — Other Ambulatory Visit: Payer: Self-pay | Admitting: Obstetrics & Gynecology

## 2019-03-23 ENCOUNTER — Ambulatory Visit (INDEPENDENT_AMBULATORY_CARE_PROVIDER_SITE_OTHER): Payer: 59 | Admitting: Obstetrics & Gynecology

## 2019-03-23 ENCOUNTER — Encounter: Payer: Self-pay | Admitting: Obstetrics & Gynecology

## 2019-03-23 ENCOUNTER — Other Ambulatory Visit: Payer: Self-pay

## 2019-03-23 VITALS — BP 135/85 | Ht 68.0 in | Wt 212.0 lb

## 2019-03-23 DIAGNOSIS — Z6832 Body mass index (BMI) 32.0-32.9, adult: Secondary | ICD-10-CM

## 2019-03-23 DIAGNOSIS — E669 Obesity, unspecified: Secondary | ICD-10-CM | POA: Diagnosis not present

## 2019-03-23 MED ORDER — PHENTERMINE HCL 37.5 MG PO TABS: ORAL_TABLET | ORAL | 1 refills | Status: DC

## 2019-03-23 NOTE — Progress Notes (Signed)
Virtual Visit via Telephone Note  I connected with Mary May on 03/23/19 at  4:10 PM EDT by telephone and verified that I am speaking with the correct person using two identifiers.   I discussed the limitations, risks, security and privacy concerns of performing an evaluation and management service by telephone and the availability of in person appointments. I also discussed with the patient that there may be a patient responsible charge related to this service. The patient expressed understanding and agreed to proceed.  She was at home and I was in my office.  History of Present Illness:  Mary May is a 48 y.o. who was started on  Current Outpatient Medications on File Prior to Visit  Medication Sig Dispense Refill  . phentermine (ADIPEX-P) 37.5 MG tablet One tablet po in morning. 30 tablet 1  . cyanocobalamin (,VITAMIN B-12,) 1000 MCG/ML injection Inject 1 mL (1,000 mcg total) into the muscle every 30 (thirty) days. (Patient not taking: Reported on 03/23/2019) 10 mL 1  approximately 3 months ago due to obesity/abnormal weight gain. The patient has lost 11 pounds over the past 2 mos due to meds and exercise..   She has these side effects: none.  PMHx: She  has a past medical history of Basal cell carcinoma. Also,  has a past surgical history that includes Cesarean section; Dilation and curettage of uterus (06/11/2013); Intrauterine device (iud) insertion (02/24/2014); Rotator cuff repair; and Breast biopsy (Left, 02/02/2019)., family history includes Bone cancer in her paternal aunt; Brain cancer in her paternal aunt; Breast cancer in her maternal aunt; Colon cancer in her maternal grandfather and paternal uncle; Heart attack in her paternal uncle; Leukemia in her father; Lung cancer in her paternal aunt; Prostate cancer in her paternal uncle.,  reports that she has never smoked. She has never used smokeless tobacco. She reports that she does not drink alcohol or use drugs.  She has a  current medication list which includes the following prescription(s): levonorgestrel, phentermine, and cyanocobalamin. Also, has No Known Allergies.  Review of Systems  All other systems reviewed and are negative.  Observations/Objective: No exam today, due to telephone eVisit due to [facility] virus restriction on elective visits and procedures.  Prior visits reviewed along with ultrasounds/labs as indicated. BP 135/85   Ht 5\' 8"  (1.727 m)   Wt 212 lb (96.2 kg)   BMI 32.23 kg/m  (reported)  Assessment and Plan: 1. Obesity (BMI 30.0-34.9) Medication treatment is going well for her.  Plan: Patient is continued/added to prescription appetite suppressants: Phentermine, self-directed dieting and increasing exercise.   Will continue to assist patient in incorporating positive experiences into her life to promote a positive mental attitude.  Education given regarding appropriate lifestyle changes for weight loss, including regular physical activity, healthy coping strategies, caloric restriction, and healthy eating patterns.  The risks and benefits as well as side effects of medication, such as Phenteramine or Tenuate, is discussed.  The pros and cons of suppressing appetite and boosting metabolism is counseled.  Risks of tolerance and addiction discussed.  Use of medicine will be short term.  Pt to call with any negative side effects and agrees to keep follow up appointments.   Follow Up Instructions: 2 months   I discussed the assessment and treatment plan with the patient. The patient was provided an opportunity to ask questions and all were answered. The patient agreed with the plan and demonstrated an understanding of the instructions.   The patient was advised to  call back or seek an in-person evaluation if the symptoms worsen or if the condition fails to improve as anticipated.  I provided 7 minutes of non-face-to-face time during this encounter.   Hoyt Koch, MD

## 2019-05-26 ENCOUNTER — Ambulatory Visit: Payer: 59 | Admitting: Obstetrics & Gynecology

## 2019-05-27 ENCOUNTER — Encounter: Payer: Self-pay | Admitting: Obstetrics & Gynecology

## 2019-05-27 ENCOUNTER — Ambulatory Visit (INDEPENDENT_AMBULATORY_CARE_PROVIDER_SITE_OTHER): Payer: 59 | Admitting: Obstetrics & Gynecology

## 2019-05-27 ENCOUNTER — Telehealth: Payer: Self-pay | Admitting: Obstetrics & Gynecology

## 2019-05-27 ENCOUNTER — Other Ambulatory Visit: Payer: Self-pay

## 2019-05-27 VITALS — Ht 68.0 in | Wt 213.0 lb

## 2019-05-27 DIAGNOSIS — Z6832 Body mass index (BMI) 32.0-32.9, adult: Secondary | ICD-10-CM | POA: Diagnosis not present

## 2019-05-27 DIAGNOSIS — E669 Obesity, unspecified: Secondary | ICD-10-CM | POA: Diagnosis not present

## 2019-05-27 MED ORDER — DIETHYLPROPION HCL 25 MG PO TABS: 1.0000 | ORAL_TABLET | Freq: Every morning | ORAL | 1 refills | Status: DC

## 2019-05-27 NOTE — Telephone Encounter (Signed)
-----   Message from Gae Dry, MD sent at 05/27/2019 11:34 AM EDT ----- Regarding: f/u 2 mos

## 2019-05-27 NOTE — Progress Notes (Signed)
Virtual Visit via Telephone Note  I connected with Mary May on 05/27/19 at 11:20 AM EDT by telephone and verified that I am speaking with the correct person using two identifiers.   I discussed the limitations, risks, security and privacy concerns of performing an evaluation and management service by telephone and the availability of in person appointments. I also discussed with the patient that there may be a patient responsible charge related to this service. The patient expressed understanding and agreed to proceed. She was at home and I was in my office.  History of Present Illness:   Mary May is a 48 y.o. who was started on Phentermine approximately 4 months ago due to obesity/abnormal weight gain. The patient reports no significant weight change..   She has these side effects: none.  PMHx: She  has a past medical history of Basal cell carcinoma. Also,  has a past surgical history that includes Cesarean section; Dilation and curettage of uterus (06/11/2013); Intrauterine device (iud) insertion (02/24/2014); Rotator cuff repair; and Breast biopsy (Left, 02/02/2019)., family history includes Bone cancer in her paternal aunt; Brain cancer in her paternal aunt; Breast cancer in her maternal aunt; Colon cancer in her maternal grandfather and paternal uncle; Heart attack in her paternal uncle; Leukemia in her father; Lung cancer in her paternal aunt; Prostate cancer in her paternal uncle.,  reports that she has never smoked. She has never used smokeless tobacco. She reports that she does not drink alcohol or use drugs.  She has a current medication list which includes the following prescription(s): cyanocobalamin, levonorgestrel, and diethylpropion hcl. Also, has No Known Allergies.  Review of Systems  All other systems reviewed and are negative.   Observations/Objective: No exam today, due to telephone eVisit due to [facility] virus restriction on elective visits and procedures.  Prior  visits reviewed along with ultrasounds/labs as indicated. Ht 5\' 8"  (1.727 m)   Wt 213 lb (96.6 kg)   BMI 32.39 kg/m  (reported)  Assessment and Plan: 1. Obesity (BMI 30.0-34.9) - change in therapy - Diethylpropion HCl 25 MG TABS; Take 1 tablet (25 mg total) by mouth every morning.  Dispense: 30 tablet; Refill: 1  Will continue to assist patient in incorporating positive experiences into her life to promote a positive mental attitude.  Education given regarding appropriate lifestyle changes for weight loss, including regular physical activity, healthy coping strategies, caloric restriction, and healthy eating patterns.  The risks and benefits as well as side effects of medication, such as Phenteramine or Tenuate, is discussed.  The pros and cons of suppressing appetite and boosting metabolism is counseled.  Risks of tolerance and addiction discussed.  Use of medicine will be short term.  Pt to call with any negative side effects and agrees to keep follow up appointments.  Follow Up Instructions: 2 mos   I discussed the assessment and treatment plan with the patient. The patient was provided an opportunity to ask questions and all were answered. The patient agreed with the plan and demonstrated an understanding of the instructions.   The patient was advised to call back or seek an in-person evaluation if the symptoms worsen or if the condition fails to improve as anticipated.  I provided 11 minutes of non-face-to-face time during this encounter.   Hoyt Koch, MD

## 2019-05-27 NOTE — Telephone Encounter (Signed)
Called and left voice mail for patient to call back to be schedule °

## 2020-01-05 ENCOUNTER — Other Ambulatory Visit: Payer: Self-pay | Admitting: Obstetrics & Gynecology

## 2020-02-02 ENCOUNTER — Encounter: Payer: Self-pay | Admitting: Obstetrics & Gynecology

## 2020-02-02 ENCOUNTER — Other Ambulatory Visit: Payer: Self-pay

## 2020-02-02 ENCOUNTER — Ambulatory Visit (INDEPENDENT_AMBULATORY_CARE_PROVIDER_SITE_OTHER): Payer: 59 | Admitting: Obstetrics & Gynecology

## 2020-02-02 ENCOUNTER — Other Ambulatory Visit (HOSPITAL_COMMUNITY)
Admission: RE | Admit: 2020-02-02 | Discharge: 2020-02-02 | Disposition: A | Discharge status: Home / Self Care | Payer: 59 | Source: Ambulatory Visit | Attending: Obstetrics & Gynecology | Admitting: Obstetrics & Gynecology

## 2020-02-02 VITALS — BP 120/82 | Ht 68.5 in | Wt 224.0 lb

## 2020-02-02 DIAGNOSIS — E669 Obesity, unspecified: Secondary | ICD-10-CM

## 2020-02-02 DIAGNOSIS — E7800 Pure hypercholesterolemia, unspecified: Secondary | ICD-10-CM | POA: Diagnosis not present

## 2020-02-02 DIAGNOSIS — Z713 Dietary counseling and surveillance: Secondary | ICD-10-CM

## 2020-02-02 DIAGNOSIS — E559 Vitamin D deficiency, unspecified: Secondary | ICD-10-CM | POA: Diagnosis not present

## 2020-02-02 DIAGNOSIS — Z1211 Encounter for screening for malignant neoplasm of colon: Secondary | ICD-10-CM

## 2020-02-02 DIAGNOSIS — Z1329 Encounter for screening for other suspected endocrine disorder: Secondary | ICD-10-CM

## 2020-02-02 DIAGNOSIS — Z124 Encounter for screening for malignant neoplasm of cervix: Secondary | ICD-10-CM | POA: Insufficient documentation

## 2020-02-02 DIAGNOSIS — Z6833 Body mass index (BMI) 33.0-33.9, adult: Secondary | ICD-10-CM

## 2020-02-02 DIAGNOSIS — Z131 Encounter for screening for diabetes mellitus: Secondary | ICD-10-CM

## 2020-02-02 DIAGNOSIS — Z1231 Encounter for screening mammogram for malignant neoplasm of breast: Secondary | ICD-10-CM

## 2020-02-02 DIAGNOSIS — Z01419 Encounter for gynecological examination (general) (routine) without abnormal findings: Secondary | ICD-10-CM | POA: Diagnosis not present

## 2020-02-02 MED ORDER — PHENTERMINE HCL 37.5 MG PO TABS: ORAL_TABLET | ORAL | 0 refills | Status: DC

## 2020-02-02 NOTE — Progress Notes (Signed)
HPI:      [name] is a 49 y.o. [phone] who LMP was No LMP recorded. (Menstrual status: IUD)., she presents today for her annual examination. The patient has no complaints today. The patient is sexually active. Her last pap: approximate date 2017 and was normal and last mammogram: approximate date 2020 and was abnormal: left SCNB neg after abn mass seen on MMG. The patient does perform self breast exams.  There is notable family history of breast or ovarian cancer in her family.  The patient has regular exercise: yes.  The patient denies current symptoms of depression.    GYN History: Contraception: IUD  PMHx: Past Medical History:  Diagnosis Date  . Basal cell carcinoma    shoulder, hip, stomach, left arm, back of right leg  . COVID-19    Past Surgical History:  Procedure Laterality Date  . BREAST BIOPSY Left 02/02/2019   pending path, [place] bx   . CESAREAN SECTION    . DILATION AND CURETTAGE OF UTERUS  06/11/2013  . INTRAUTERINE DEVICE (IUD) INSERTION  02/24/2014   mirena  . ROTATOR CUFF REPAIR     Family History  Problem Relation Age of Onset  . Breast cancer Maternal Aunt   . Leukemia Father   . Brain cancer Paternal Aunt   . Lung cancer Paternal Aunt   . Bone cancer Paternal Aunt   . Colon cancer Paternal Uncle   . Prostate cancer Paternal Uncle   . Heart attack Paternal Uncle   . Colon cancer Maternal Grandfather    Social History   Tobacco Use  . Smoking status: Never Smoker  . Smokeless tobacco: Never Used  Substance Use Topics  . Alcohol use: No  . Drug use: No    Current Outpatient Medications:  .  levonorgestrel (MIRENA) 20 MCG/24HR IUD, 1 each by Intrauterine route once., Disp: , Rfl:  .  phentermine (ADIPEX-P) 37.5 MG tablet, One tablet po in morning., Disp: 30 tablet, Rfl: 0 Allergies: Patient has no known allergies.  Review of Systems  Constitutional: Negative for chills, fever and malaise/fatigue.  HENT: Negative for congestion, sinus  pain and sore throat.   Eyes: Negative for blurred vision and pain.  Respiratory: Negative for cough and wheezing.   Cardiovascular: Negative for chest pain and leg swelling.  Gastrointestinal: Negative for abdominal pain, constipation, diarrhea, heartburn, nausea and vomiting.  Genitourinary: Negative for dysuria, frequency, hematuria and urgency.  Musculoskeletal: Negative for back pain, joint pain, myalgias and neck pain.  Skin: Negative for itching and rash.  Neurological: Negative for dizziness, tremors and weakness.  Endo/Heme/Allergies: Does not bruise/bleed easily.  Psychiatric/Behavioral: Negative for depression. The patient is not nervous/anxious and does not have insomnia.     Objective: BP 120/82   Ht 5' 8.5" (1.74 m)   Wt 224 lb (101.6 kg)   BMI 33.56 kg/m   Filed Weights   02/02/20 1344  Weight: 224 lb (101.6 kg)   Body mass index is 33.56 kg/m. Physical Exam Constitutional:      General: She is not in acute distress.    Appearance: She is well-developed.  Genitourinary:     Pelvic exam was performed with patient supine.     Vagina, uterus and rectum normal.     No lesions in the vagina.     No vaginal bleeding.     No cervical motion tenderness, friability, lesion or polyp.     Uterus is mobile.     Uterus is  not enlarged.     No uterine mass detected.    Uterus is midaxial.     No right or left adnexal mass present.     Right adnexa not tender.     Left adnexa not tender.  HENT:     Head: Normocephalic and atraumatic. No laceration.     Right Ear: Hearing normal.     Left Ear: Hearing normal.     Mouth/Throat:     Pharynx: Uvula midline.  Eyes:     Pupils: Pupils are equal, round, and reactive to light.  Neck:     Thyroid: No thyromegaly.  Cardiovascular:     Rate and Rhythm: Normal rate and regular rhythm.     Heart sounds: No murmur. No friction rub. No gallop.   Pulmonary:     Effort: Pulmonary effort is normal. No respiratory distress.      Breath sounds: Normal breath sounds. No wheezing.  Chest:     Breasts:        Right: No mass, skin change or tenderness.        Left: No mass, skin change or tenderness.  Abdominal:     General: Bowel sounds are normal. There is no distension.     Palpations: Abdomen is soft.     Tenderness: There is no abdominal tenderness. There is no rebound.  Musculoskeletal:        General: Normal range of motion.     Cervical back: Normal range of motion and neck supple.  Neurological:     Mental Status: She is alert and oriented to person, place, and time.     Cranial Nerves: No cranial nerve deficit.  Skin:    General: Skin is warm and dry.  Psychiatric:        Judgment: Judgment normal.  Vitals reviewed.     Assessment:  ANNUAL EXAM 1. Women's annual routine gynecological examination   2. Screening for cervical cancer   3. Encounter for screening mammogram for malignant neoplasm of breast   4. Screen for colon cancer   5. Obesity (BMI 30.0-34.9)   6. Hypercholesteremia   7. Vitamin D deficiency   8. Screening for thyroid disorder   9. Screening for diabetes mellitus      Screening Plan:            1.  Cervical Screening-  Pap smear done today  2. Breast screening- Exam annually and mammogram>40 planned   3. Colonoscopy every 10 years, Hemoccult testing - after age 38  4. Labs To return fasting at a later date  5. Counseling for contraception: IUD  Year 1   6. Obesity (BMI 30.0-34.9) - Weight loss counseled - Gained some weight while sick from Covid in January - phentermine (ADIPEX-P) 37.5 MG tablet; One tablet po in morning.  Dispense: 30 tablet; Refill: 0    F/U  Return in about 4 weeks (around 03/01/2020) for Follow up appt on a WED am (for fasting labs and medicine f/u).  Barnett Applebaum, MD, [facility], [facility] 02/02/2020  2:16 PM

## 2020-02-02 NOTE — Patient Instructions (Addendum)
PAP every three years Mammogram every year    Call [phone] to schedule at [facility] Colonoscopy every 10 years Labs soon  Phentermine tablets or capsules What is this medicine? PHENTERMINE (FEN ter meen) decreases your appetite. It is used with a reduced calorie diet and exercise to help you lose weight. This medicine may be used for other purposes; ask your health care provider or pharmacist if you have questions. COMMON BRAND NAME(S): Adipex-P, Atti-Plex P, Atti-Plex P Spansule, Fastin, Lomaira, Pro-Fast, Tara-8 What should I tell my health care provider before I take this medicine? They need to know if you have any of these conditions:  agitation or nervousness  diabetes  glaucoma  heart disease  high blood pressure  history of drug abuse or addiction  history of stroke  kidney disease  lung disease called Primary Pulmonary Hypertension (PPH)  taken an MAOI like Carbex, Eldepryl, Marplan, Nardil, or Parnate in last 14 days  taking stimulant medicines for attention disorders, weight loss, or to stay awake  thyroid disease  an unusual or allergic reaction to phentermine, other medicines, foods, dyes, or preservatives  pregnant or trying to get pregnant  breast-feeding How should I use this medicine? Take this medicine by mouth with a glass of water. Follow the directions on the prescription label. Take your medicine at regular intervals. Do not take it more often than directed. Do not stop taking except on your doctor's advice. Talk to your pediatrician regarding the use of this medicine in children. While this drug may be prescribed for children 17 years or older for selected conditions, precautions do apply. Overdosage: If you think you have taken too much of this medicine contact a poison control center or emergency room at once. NOTE: This medicine is only for you. Do not share this medicine with others. What if I miss a dose? If you miss a dose, take it as  soon as you can. If it is almost time for your next dose, take only that dose. Do not take double or extra doses. What may interact with this medicine? Do not take this medicine with any of the following medications:  MAOIs like Carbex, Eldepryl, Marplan, Nardil, and Parnate This medicine may also interact with the following medications:  alcohol  certain medicines for depression, anxiety, or psychotic disorders  certain medicines for high blood pressure  linezolid  medicines for colds or breathing difficulties like pseudoephedrine or phenylephrine  medicines for diabetes  sibutramine  stimulant medicines for attention disorders, weight loss, or to stay awake This list may not describe all possible interactions. Give your health care provider a list of all the medicines, herbs, non-prescription drugs, or dietary supplements you use. Also tell them if you smoke, drink alcohol, or use illegal drugs. Some items may interact with your medicine. What should I watch for while using this medicine? Visit your doctor or health care provider for regular checks on your progress. Do not stop taking except on your health care provider's advice. You may develop a severe reaction. Your health care provider will tell you how much medicine to take. Do not take this medicine close to bedtime. It may prevent you from sleeping. You may get drowsy or dizzy. Do not drive, use machinery, or do anything that needs mental alertness until you know how this medicine affects you. Do not stand or sit up quickly, especially if you are an older patient. This reduces the risk of dizzy or fainting spells. Alcohol may increase dizziness and  drowsiness. Avoid alcoholic drinks. This medicine may affect blood sugar levels. Ask your healthcare provider if changes in diet or medicines are needed if you have diabetes. Women should inform their health care provider if they wish to become pregnant or think they might be  pregnant. Losing weight while pregnant is not advised and may cause harm to the unborn child. Talk to your health care provider for more information. What side effects may I notice from receiving this medicine? Side effects that you should report to your doctor or health care professional as soon as possible:  allergic reactions like skin rash, itching or hives, swelling of the face, lips, or tongue  breathing problems  changes in emotions or moods  changes in vision  chest pain or chest tightness  fast, irregular heartbeat  feeling faint or lightheaded  increased blood pressure  irritable  restlessness  tremors  seizures  signs and symptoms of a stroke like changes in vision; confusion; trouble speaking or understanding; severe headaches; sudden numbness or weakness of the face, arm or leg; trouble walking; dizziness; loss of balance or coordination  unusually weak or tired Side effects that usually do not require medical attention (report to your doctor or health care professional if they continue or are bothersome):  changes in taste  constipation or diarrhea  dizziness  dry mouth  headache  trouble sleeping  upset stomach This list may not describe all possible side effects. Call your doctor for medical advice about side effects. You may report side effects to FDA at 1-800-FDA-1088. Where should I keep my medicine? Keep out of the reach of children. This medicine can be abused. Keep your medicine in a safe place to protect it from theft. Do not share this medicine with anyone. Selling or giving away this medicine is dangerous and against the law. This medicine may cause harm and death if it is taken by other adults, children, or pets. Return medicine that has not been used to an official disposal site. Contact the DEA at 336-282-2760 or your city/county government to find a site. If you cannot return the medicine, mix any unused medicine with a substance like  cat litter or coffee grounds. Then throw the medicine away in a sealed container like a sealed bag or coffee can with a lid. Do not use the medicine after the expiration date. Store at room temperature between 20 and 25 degrees C (68 and 77 degrees F). Keep container tightly closed. NOTE: This sheet is a summary. It may not cover all possible information. If you have questions about this medicine, talk to your doctor, pharmacist, or health care provider.  2020 Elsevier/Gold Standard (2019-08-28 12:54:20)

## 2020-02-04 LAB — CYTOLOGY - PAP
Comment: NEGATIVE
Diagnosis: NEGATIVE
High risk HPV: NEGATIVE

## 2020-02-11 ENCOUNTER — Encounter: Payer: Self-pay | Admitting: *Deleted

## 2020-02-18 ENCOUNTER — Ambulatory Visit (INDEPENDENT_AMBULATORY_CARE_PROVIDER_SITE_OTHER): Payer: Self-pay | Admitting: Gastroenterology

## 2020-02-18 ENCOUNTER — Other Ambulatory Visit: Payer: Self-pay

## 2020-02-18 DIAGNOSIS — Z1211 Encounter for screening for malignant neoplasm of colon: Secondary | ICD-10-CM

## 2020-02-18 NOTE — Progress Notes (Signed)
Gastroenterology Pre-Procedure Review  Request Date: Friday 04/22/20 Requesting Physician: Dr. Vicente Males  PATIENT REVIEW QUESTIONS: The patient responded to the following health history questions as indicated:    1. Are you having any GI issues? no 2. Do you have a personal history of Polyps? no 3. Do you have a family history of Colon Cancer or Polyps? yes (Father colon polyps) 4. Diabetes Mellitus? no 5. Joint replacements in the past 12 months?no 6. Major health problems in the past 3 months?no 7. Any artificial heart valves, MVP, or defibrillator?no    MEDICATIONS & ALLERGIES:    Patient reports the following regarding taking any anticoagulation/antiplatelet therapy:   Plavix, Coumadin, Eliquis, Xarelto, Lovenox, Pradaxa, Brilinta, or Effient? no Aspirin? no  Patient confirms/reports the following medications:  Current Outpatient Medications  Medication Sig Dispense Refill  . levonorgestrel (MIRENA) 20 MCG/24HR IUD 1 each by Intrauterine route once.    . phentermine (ADIPEX-P) 37.5 MG tablet One tablet po in morning. 30 tablet 0   No current facility-administered medications for this visit.    Patient confirms/reports the following allergies:  Allergies  Allergen Reactions  . No Known Allergies     No orders of the defined types were placed in this encounter.   AUTHORIZATION INFORMATION Primary Insurance: 1D#: Group #:  Secondary Insurance: 1D#: Group #:  SCHEDULE INFORMATION: Date: Friday 04/22/20 Time: Location:[facility]

## 2020-03-02 ENCOUNTER — Other Ambulatory Visit: Payer: Self-pay

## 2020-03-02 ENCOUNTER — Encounter: Payer: Self-pay | Admitting: Obstetrics & Gynecology

## 2020-03-02 ENCOUNTER — Ambulatory Visit (INDEPENDENT_AMBULATORY_CARE_PROVIDER_SITE_OTHER): Payer: 59 | Admitting: Obstetrics & Gynecology

## 2020-03-02 ENCOUNTER — Other Ambulatory Visit: Payer: 59

## 2020-03-02 ENCOUNTER — Ambulatory Visit
Admission: RE | Admit: 2020-03-02 | Discharge: 2020-03-02 | Disposition: A | Discharge status: Home / Self Care | Payer: 59 | Source: Ambulatory Visit | Attending: Obstetrics & Gynecology | Admitting: Obstetrics & Gynecology

## 2020-03-02 VITALS — BP 120/80 | Ht 68.5 in | Wt 218.0 lb

## 2020-03-02 DIAGNOSIS — Z1329 Encounter for screening for other suspected endocrine disorder: Secondary | ICD-10-CM | POA: Diagnosis not present

## 2020-03-02 DIAGNOSIS — Z1231 Encounter for screening mammogram for malignant neoplasm of breast: Secondary | ICD-10-CM

## 2020-03-02 DIAGNOSIS — E559 Vitamin D deficiency, unspecified: Secondary | ICD-10-CM | POA: Diagnosis not present

## 2020-03-02 DIAGNOSIS — Z131 Encounter for screening for diabetes mellitus: Secondary | ICD-10-CM | POA: Diagnosis not present

## 2020-03-02 DIAGNOSIS — E7800 Pure hypercholesterolemia, unspecified: Secondary | ICD-10-CM

## 2020-03-02 DIAGNOSIS — E669 Obesity, unspecified: Secondary | ICD-10-CM

## 2020-03-02 NOTE — Progress Notes (Signed)
  History of Present Illness:  Mary May is a 49 y.o. who was started on  Medication Sig Dispense Refill  . phentermine (ADIPEX-P) 37.5 MG tablet One tablet po in morning. 30 tablet 0  approximately 1 month ago due to obesity/abnormal weight gain. The patient has lost 6 pounds over the past month due to lifestyle changes and meds..   She has these side effects: none.  PMHx: She  has a past medical history of Basal cell carcinoma and COVID-19. Also,  has a past surgical history that includes Cesarean section; Dilation and curettage of uterus (06/11/2013); Intrauterine device (iud) insertion (02/24/2014); Rotator cuff repair; and Breast biopsy (Left, 02/02/2019)., family history includes Bone cancer in her paternal aunt; Brain cancer in her paternal aunt; Breast cancer in her maternal aunt; Colon cancer in her maternal grandfather and paternal uncle; Heart attack in her paternal uncle; Leukemia in her father; Lung cancer in her paternal aunt; Prostate cancer in her paternal uncle.,  reports that she has never smoked. She has never used smokeless tobacco. She reports that she does not drink alcohol or use drugs.  She has a current medication list which includes the following prescription(s): levonorgestrel and phentermine. Also, is allergic to no known allergies.  Review of Systems  All other systems reviewed and are negative.   Physical Exam:  BP 120/80   Ht 5' 8.5" (1.74 m)   Wt 218 lb (98.9 kg)   BMI 32.66 kg/m  Body mass index is 32.66 kg/m. Filed Weights   03/02/20 0814  Weight: 218 lb (98.9 kg)    Physical Exam Constitutional:      General: She is not in acute distress.    Appearance: She is well-developed.  Musculoskeletal:        General: Normal range of motion.  Neurological:     Mental Status: She is alert and oriented to person, place, and time.  Skin:    General: Skin is warm and dry.  Vitals reviewed.     Assessment: obesity Medication treatment is going well  for her.  Plan: Patient is continued/added to prescription appetite suppressants: Phentermine, self-directed dieting and excercise routine.   Will continue to assist patient in incorporating positive experiences into her life to promote a positive mental attitude.  Education given regarding appropriate lifestyle changes for weight loss, including regular physical activity, healthy coping strategies, caloric restriction, and healthy eating patterns.  The risks and benefits as well as side effects of medication, such as Phenteramine or Tenuate, is discussed.  The pros and cons of suppressing appetite and boosting metabolism is counseled.  Risks of tolerance and addiction discussed.  Use of medicine will be short term.  Pt to call with any negative side effects and agrees to keep follow up appointments.  Also, fasting labs today per screening protocol  A total of 20 minutes were spent face-to-face with the patient as well as preparation, review, communication, and documentation during this encounter.   Barnett Applebaum, MD, [facility], [facility] 03/02/2020  8:31 AM

## 2020-03-03 LAB — LIPID PANEL
Chol/HDL Ratio: 4.7 ratio — ABNORMAL HIGH (ref 0.0–4.4)
Cholesterol, Total: 216 mg/dL — ABNORMAL HIGH (ref 100–199)
HDL: 46 mg/dL (ref >39)
LDL Chol Calc (NIH): 157 mg/dL — ABNORMAL HIGH (ref 0–99)
Triglycerides: 71 mg/dL (ref 0–149)
VLDL Cholesterol Cal: 13 mg/dL (ref 5–40)

## 2020-03-03 LAB — TSH: TSH: 2.87 u[IU]/mL (ref 0.450–4.500)

## 2020-03-03 LAB — VITAMIN D 25 HYDROXY (VIT D DEFICIENCY, FRACTURES): Vit D, 25-Hydroxy: 28.9 ng/mL — ABNORMAL LOW (ref 30.0–100.0)

## 2020-03-03 LAB — GLUCOSE, FASTING: Glucose, Plasma: 95 mg/dL (ref 65–99)

## 2020-03-18 ENCOUNTER — Other Ambulatory Visit: Payer: Self-pay | Admitting: Obstetrics & Gynecology

## 2020-03-18 DIAGNOSIS — E669 Obesity, unspecified: Secondary | ICD-10-CM

## 2020-03-23 NOTE — Telephone Encounter (Signed)
Patient requesting return call regarding declined refill request. [phone]

## 2020-03-23 NOTE — Telephone Encounter (Signed)
LMVM TRC. 

## 2020-03-24 ENCOUNTER — Other Ambulatory Visit: Payer: Self-pay | Admitting: Obstetrics & Gynecology

## 2020-03-24 DIAGNOSIS — E669 Obesity, unspecified: Secondary | ICD-10-CM

## 2020-03-24 MED ORDER — PHENTERMINE HCL 37.5 MG PO TABS: ORAL_TABLET | ORAL | 0 refills | Status: DC

## 2020-03-24 NOTE — Telephone Encounter (Signed)
Pt aware.

## 2020-03-24 NOTE — Telephone Encounter (Signed)
Spoke to patient. She has been out of meds for a week now.  Advised I see that she was seen 4/28 & advised to f/u in 2 mos and has apt for 05/02/20. Apologized for the delay/confusion. Will send to [facility] for refills.

## 2020-04-20 ENCOUNTER — Other Ambulatory Visit: Payer: Self-pay

## 2020-04-20 ENCOUNTER — Other Ambulatory Visit
Admission: RE | Admit: 2020-04-20 | Discharge: 2020-04-20 | Disposition: A | Discharge status: Home / Self Care | Payer: 59 | Source: Ambulatory Visit | Attending: Gastroenterology | Admitting: Gastroenterology

## 2020-04-20 DIAGNOSIS — Z20822 Contact with and (suspected) exposure to covid-19: Secondary | ICD-10-CM | POA: Insufficient documentation

## 2020-04-20 DIAGNOSIS — Z01812 Encounter for preprocedural laboratory examination: Secondary | ICD-10-CM | POA: Diagnosis present

## 2020-04-20 LAB — SARS CORONAVIRUS 2 (TAT 6-24 HRS): SARS Coronavirus 2: NEGATIVE

## 2020-04-22 ENCOUNTER — Encounter: Payer: Self-pay | Admitting: Gastroenterology

## 2020-04-22 ENCOUNTER — Ambulatory Visit
Admission: RE | Admit: 2020-04-22 | Discharge: 2020-04-22 | Disposition: A | Discharge status: Home / Self Care | Payer: 59 | Attending: Gastroenterology | Admitting: Gastroenterology

## 2020-04-22 ENCOUNTER — Ambulatory Visit: Payer: 59 | Admitting: Anesthesiology

## 2020-04-22 ENCOUNTER — Other Ambulatory Visit: Payer: Self-pay

## 2020-04-22 ENCOUNTER — Encounter
Admission: RE | Disposition: A | Discharge status: Home / Self Care | Payer: Self-pay | Source: Home / Self Care | Attending: Gastroenterology

## 2020-04-22 DIAGNOSIS — Z1211 Encounter for screening for malignant neoplasm of colon: Secondary | ICD-10-CM | POA: Diagnosis present

## 2020-04-22 DIAGNOSIS — Z975 Presence of (intrauterine) contraceptive device: Secondary | ICD-10-CM | POA: Insufficient documentation

## 2020-04-22 DIAGNOSIS — Z8616 Personal history of COVID-19: Secondary | ICD-10-CM | POA: Diagnosis not present

## 2020-04-22 DIAGNOSIS — K635 Polyp of colon: Secondary | ICD-10-CM | POA: Diagnosis not present

## 2020-04-22 DIAGNOSIS — Z8371 Family history of colonic polyps: Secondary | ICD-10-CM | POA: Diagnosis not present

## 2020-04-22 DIAGNOSIS — D126 Benign neoplasm of colon, unspecified: Secondary | ICD-10-CM | POA: Diagnosis not present

## 2020-04-22 HISTORY — PX: COLONOSCOPY WITH PROPOFOL: SHX5780

## 2020-04-22 LAB — POCT PREGNANCY, URINE: Preg Test, Ur: NEGATIVE

## 2020-04-22 SURGERY — COLONOSCOPY WITH PROPOFOL
Anesthesia: General

## 2020-04-22 MED ORDER — LIDOCAINE HCL (CARDIAC) PF 100 MG/5ML IV SOSY
PREFILLED_SYRINGE | INTRAVENOUS | Status: DC | PRN
  Administered 2020-04-22: 100 mg via INTRAVENOUS

## 2020-04-22 MED ORDER — PROPOFOL 500 MG/50ML IV EMUL
INTRAVENOUS | Status: DC | PRN
  Administered 2020-04-22: 130 ug/kg/min via INTRAVENOUS

## 2020-04-22 MED ORDER — SODIUM CHLORIDE 0.9 % IV SOLN
INTRAVENOUS | Status: DC
  Administered 2020-04-22: 1000 mL via INTRAVENOUS

## 2020-04-22 MED ORDER — PROPOFOL 10 MG/ML IV BOLUS
INTRAVENOUS | Status: DC | PRN
  Administered 2020-04-22: 20 mg via INTRAVENOUS
  Administered 2020-04-22: 100 mg via INTRAVENOUS

## 2020-04-22 NOTE — Anesthesia Postprocedure Evaluation (Signed)
Anesthesia Post Note  Patient: Mary May  Procedure(s) Performed: COLONOSCOPY WITH PROPOFOL (N/A )  Patient location during evaluation: Endoscopy Anesthesia Type: General Level of consciousness: awake and alert and oriented Pain management: pain level controlled Vital Signs Assessment: post-procedure vital signs reviewed and stable Respiratory status: spontaneous breathing, nonlabored ventilation and respiratory function stable Cardiovascular status: blood pressure returned to baseline and stable Postop Assessment: no signs of nausea or vomiting Anesthetic complications: no   No complications documented.   Last Vitals:  Vitals:   04/22/20 1025 04/22/20 1035  BP: 100/66 116/64  Pulse: 85 86  Resp: 16 16  Temp: (!) 36.3 C   SpO2: 100% 100%    Last Pain:  Vitals:   04/22/20 1025  TempSrc: Temporal                 Forrestine Lecrone

## 2020-04-22 NOTE — Transfer of Care (Signed)
Immediate Anesthesia Transfer of Care Note  Patient: Mary May  Procedure(s) Performed: COLONOSCOPY WITH PROPOFOL (N/A )  Patient Location: PACU and Endoscopy Unit  Anesthesia Type:General  Level of Consciousness: awake, drowsy and patient cooperative  Airway & Oxygen Therapy: Patient Spontanous Breathing  Post-op Assessment: Report given to RN, Post -op Vital signs reviewed and stable and Patient moving all extremities  Post vital signs: Reviewed and stable  Last Vitals:  Vitals Value Taken Time  BP 100/66 04/22/20 1025  Temp    Pulse 85 04/22/20 1025  Resp 16 04/22/20 1025  SpO2 100 % 04/22/20 1025    Last Pain:  Vitals:   04/22/20 0858  TempSrc: Tympanic         Complications: No complications documented.

## 2020-04-22 NOTE — Op Note (Signed)
[facility] Gastroenterology Patient Name: Mary May Procedure Date: 04/22/2020 9:57 AM MRN: [id number] Account #: [id number] Date of Birth: 1971/05/31 Admit Type: Outpatient Age: 49 Room: [facility] ENDO ROOM 3 Gender: Female Note Status: Finalized Procedure:             Colonoscopy Indications:           Colon cancer screening in patient at increased risk:                         Family history of 1st-degree relative with colon polyps Providers:             Jonathon Bellows MD, MD Referring MD:          Laverne (Referring MD) Medicines:             Monitored Anesthesia Care Complications:         No immediate complications. Procedure:             Pre-Anesthesia Assessment:                        - Prior to the procedure, a History and Physical was                         performed, and patient medications, allergies and                         sensitivities were reviewed. The patient's tolerance                         of previous anesthesia was reviewed.                        - The risks and benefits of the procedure and the                         sedation options and risks were discussed with the                         patient. All questions were answered and informed                         consent was obtained.                        - ASA Grade Assessment: I - A normal, healthy patient.                        After obtaining informed consent, the colonoscope was                         passed under direct vision. Throughout the procedure,                         the patient's blood pressure, pulse, and oxygen                         saturations were monitored continuously. The  Colonoscope was introduced through the anus and                         advanced to the the cecum, identified by the                         appendiceal orifice. The colonoscopy was performed                         with ease. The patient  tolerated the procedure well.                         The quality of the bowel preparation was excellent. Findings:      The perianal and digital rectal examinations were normal.      A 5 mm polyp was found in the sigmoid colon. The polyp was sessile. The       polyp was removed with a cold snare. Resection and retrieval were       complete.      The exam was otherwise without abnormality on direct and retroflexion       views. Impression:            - One 5 mm polyp in the sigmoid colon, removed with a                         cold snare. Resected and retrieved.                        - The examination was otherwise normal on direct and                         retroflexion views. Recommendation:        - Discharge patient to home (with escort).                        - Resume previous diet.                        - Continue present medications.                        - Await pathology results.                        - Repeat colonoscopy for surveillance based on                         pathology results. Procedure Code(s):     --- Professional ---                        [phone], Colonoscopy, flexible; with removal of                         tumor(s), polyp(s), or other lesion(s) by snare                         technique Diagnosis Code(s):     --- Professional ---  Z83.71, Family history of colonic polyps                        K63.5, Polyp of colon CPT copyright 2019 American Medical Association. All rights reserved. The codes documented in this report are preliminary and upon coder review may  be revised to meet current compliance requirements. Jonathon Bellows, MD Jonathon Bellows MD, MD 04/22/2020 10:18:32 AM This report has been signed electronically. Number of Addenda: 0 Note Initiated On: 04/22/2020 9:57 AM Scope Withdrawal Time: 0 hours 10 minutes 35 seconds  Total Procedure Duration: 0 hours 12 minutes 21 seconds  Estimated Blood Loss:  Estimated blood loss:  none.      [facility]

## 2020-04-22 NOTE — Anesthesia Preprocedure Evaluation (Signed)
Anesthesia Evaluation  Patient identified by MRN, date of birth, ID band Patient awake    Reviewed: Allergy & Precautions, NPO status , Patient's Chart, lab work & pertinent test results  History of Anesthesia Complications Negative for: history of anesthetic complications  Airway Mallampati: II  TM Distance: >3 FB Neck ROM: Full    Dental no notable dental hx.    Pulmonary neg pulmonary ROS, neg sleep apnea, neg COPD,    breath sounds clear to auscultation- rhonchi (-) wheezing      Cardiovascular Exercise Tolerance: Good (-) hypertension(-) CAD and (-) Past MI  Rhythm:Regular Rate:Normal - Systolic murmurs and - Diastolic murmurs    Neuro/Psych negative neurological ROS  negative psych ROS   GI/Hepatic negative GI ROS, Neg liver ROS,   Endo/Other  negative endocrine ROSneg diabetes  Renal/GU negative Renal ROS     Musculoskeletal negative musculoskeletal ROS (+)   Abdominal (+) - obese,   Peds  Hematology negative hematology ROS (+)   Anesthesia Other Findings Past Medical History: No date: Basal cell carcinoma     Comment:  shoulder, hip, stomach, left arm, back of right leg No date: COVID-19   Reproductive/Obstetrics                             Anesthesia Physical Anesthesia Plan  ASA: I  Anesthesia Plan: General   Post-op Pain Management:    Induction: Intravenous  PONV Risk Score and Plan: 2 and Propofol infusion  Airway Management Planned: Natural Airway  Additional Equipment:   Intra-op Plan:   Post-operative Plan:   Informed Consent: I have reviewed the patients History and Physical, chart, labs and discussed the procedure including the risks, benefits and alternatives for the proposed anesthesia with the patient or authorized representative who has indicated his/her understanding and acceptance.     Dental advisory given  Plan Discussed with: CRNA and  Anesthesiologist  Anesthesia Plan Comments:         Anesthesia Quick Evaluation

## 2020-04-22 NOTE — H&P (Signed)
Jonathon Bellows, MD [address] [place], [place], [place], [address] [address], [place], [place], [place], [address] Phone: [phone]  Fax: [phone]  Primary Care Physician:  Patient, No Pcp Per   Pre-Procedure History & Physical: HPI:  SHIRLA HODGKISS is a 49 y.o. female is here for an colonoscopy.   Past Medical History:  Diagnosis Date  . Basal cell carcinoma    shoulder, hip, stomach, left arm, back of right leg  . COVID-19     Past Surgical History:  Procedure Laterality Date  . BREAST BIOPSY Left 02/02/2019   pending path, [place] bx   . CESAREAN SECTION    . DILATION AND CURETTAGE OF UTERUS  06/11/2013  . INTRAUTERINE DEVICE (IUD) INSERTION  02/24/2014   mirena  . ROTATOR CUFF REPAIR      Prior to Admission medications   Medication Sig Start Date End Date Taking? Authorizing Provider  levonorgestrel (MIRENA) 20 MCG/24HR IUD 1 each by Intrauterine route once.    [provider]  phentermine (ADIPEX-P) 37.5 MG tablet One tablet po in morning. 03/24/20   Gae Dry, MD    Allergies as of 02/18/2020 - Review Complete 02/18/2020  Allergen Reaction Noted  . No known allergies  02/18/2020    Family History  Problem Relation Age of Onset  . Breast cancer Maternal Aunt   . Leukemia Father   . Brain cancer Paternal Aunt   . Lung cancer Paternal Aunt   . Bone cancer Paternal Aunt   . Colon cancer Paternal Uncle   . Prostate cancer Paternal Uncle   . Heart attack Paternal Uncle   . Colon cancer Maternal Grandfather     Social History   Socioeconomic History  . Marital status: Married    Spouse name: Not on file  . Number of children: Not on file  . Years of education: Not on file  . Highest education level: Not on file  Occupational History  . Not on file  Tobacco Use  . Smoking status: Never Smoker  . Smokeless tobacco: Never Used  Vaping Use  . Vaping Use: Never used  Substance and Sexual Activity  . Alcohol use: No  . Drug  use: No  . Sexual activity: Yes    Birth control/protection: I.U.D.  Other Topics Concern  . Not on file  Social History Narrative  . Not on file   Social Determinants of Health   Financial Resource Strain:   . Difficulty of Paying Living Expenses:   Food Insecurity:   . Worried About Charity fundraiser in the Last Year:   . Arboriculturist in the Last Year:   Transportation Needs:   . Film/video editor (Medical):   Marland Kitchen Lack of Transportation (Non-Medical):   Physical Activity:   . Days of Exercise per Week:   . Minutes of Exercise per Session:   Stress:   . Feeling of Stress :   Social Connections:   . Frequency of Communication with Friends and Family:   . Frequency of Social Gatherings with Friends and Family:   . Attends Religious Services:   . Active Member of Clubs or Organizations:   . Attends Archivist Meetings:   Marland Kitchen Marital Status:   Intimate Partner Violence:   . Fear of Current or Ex-Partner:   . Emotionally Abused:   Marland Kitchen Physically Abused:   . Sexually Abused:     Review of Systems: See HPI, otherwise  negative ROS  Physical Exam: BP (!) 144/99   Pulse 73   Temp (!) 97.3 F (36.3 C) (Tympanic)   Resp 18   Ht 5' 8.5" (1.74 m)   Wt 93.9 kg   SpO2 100%   BMI 31.02 kg/m  General:   Alert,  pleasant and cooperative in NAD Head:  Normocephalic and atraumatic. Neck:  Supple; no masses or thyromegaly. Lungs:  Clear throughout to auscultation, normal respiratory effort.    Heart:  +S1, +S2, Regular rate and rhythm, No edema. Abdomen:  Soft, nontender and nondistended. Normal bowel sounds, without guarding, and without rebound.   Neurologic:  Alert and  oriented x4;  grossly normal neurologically.  Impression/Plan: TIGERLILY CHRISTINE is here for an colonoscopy to be performed for Screening colonoscopy , father had colon polyps Risks, benefits, limitations, and alternatives regarding  colonoscopy have been reviewed with the patient.  Questions have  been answered.  All parties agreeable.   Jonathon Bellows, MD  04/22/2020, 9:49 AM

## 2020-04-25 ENCOUNTER — Encounter: Payer: Self-pay | Admitting: Gastroenterology

## 2020-04-25 LAB — SURGICAL PATHOLOGY

## 2020-04-26 ENCOUNTER — Encounter: Payer: Self-pay | Admitting: Gastroenterology

## 2020-05-02 ENCOUNTER — Encounter: Payer: Self-pay | Admitting: Obstetrics & Gynecology

## 2020-05-02 ENCOUNTER — Other Ambulatory Visit: Payer: Self-pay

## 2020-05-02 ENCOUNTER — Telehealth (INDEPENDENT_AMBULATORY_CARE_PROVIDER_SITE_OTHER): Payer: 59 | Admitting: Obstetrics & Gynecology

## 2020-05-02 DIAGNOSIS — E669 Obesity, unspecified: Secondary | ICD-10-CM

## 2020-05-02 MED ORDER — PHENTERMINE HCL 37.5 MG PO TABS: ORAL_TABLET | ORAL | 1 refills | Status: DC

## 2020-05-02 MED ORDER — TOPIRAMATE 25 MG PO TABS: 25.0000 mg | ORAL_TABLET | Freq: Every day | ORAL | 6 refills | Status: DC

## 2020-05-02 NOTE — Progress Notes (Signed)
Virtual Visit via Video Note  I connected with Mary May on 05/02/20 at 11:00 AM EDT by a video enabled telemedicine application and verified that I am speaking with the correct person using two identifiers.  Location: Patient: Work [employer] copy: Office   I discussed the limitations of evaluation and management by telemedicine and the availability of in person appointments. The patient expressed understanding and agreed to proceed.  History of Present Illness: Mary May is a 49 y.o. who was started on Phentermine approximately 3 months ago due to obesity/abnormal weight gain. The patient has lost 6 pounds over the past 2 mos due to meds as well as lifestyle measures..   She has these side effects: none.  PMHx: She  has a past medical history of Basal cell carcinoma and COVID-19. Also,  has a past surgical history that includes Cesarean section; Dilation and curettage of uterus (06/11/2013); Intrauterine device (iud) insertion (02/24/2014); Rotator cuff repair; Breast biopsy (Left, 02/02/2019); and Colonoscopy with propofol (N/A, 04/22/2020)., family history includes Bone cancer in her paternal aunt; Brain cancer in her paternal aunt; Breast cancer in her maternal aunt; Colon cancer in her maternal grandfather and paternal uncle; Heart attack in her paternal uncle; Leukemia in her father; Lung cancer in her paternal aunt; Prostate cancer in her paternal uncle.,  reports that she has never smoked. She has never used smokeless tobacco. She reports that she does not drink alcohol and does not use drugs.  She has a current medication list which includes the following prescription(s): levonorgestrel, phentermine, and topiramate. Also, is allergic to no known allergies.  Review of Systems  All other systems reviewed and are negative.     Observations/Objective: No exam today, due to telephone eVisit due to [facility] virus restriction on elective visits and procedures.  Prior visits  reviewed along with ultrasounds/labs as indicated. From Home: There were no vitals taken for this visit. There is no height or weight on file to calculate BMI. There were no vitals filed for this visit.  Assessment and Plan:   ICD-10-CM   1. Obesity (BMI 30.0-34.9)  E66.9 phentermine (ADIPEX-P) 37.5 MG tablet   Assessment: obesity Medication treatment is going well for her.  Plan: Patient is continued/added to prescription appetite suppressants: Phentermine and will add Topamax as a supplement to this regimen.   Will continue to assist patient in incorporating positive experiences into her life to promote a positive mental attitude.  Education given regarding appropriate lifestyle changes for weight loss, including regular physical activity, healthy coping strategies, caloric restriction, and healthy eating patterns.  The risks and benefits as well as side effects of medication, such as Phenteramine or Tenuate, is discussed.  The pros and cons of suppressing appetite and boosting metabolism is counseled.  Risks of tolerance and addiction discussed.  Use of medicine will be short term.  Pt to call with any negative side effects and agrees to keep follow up appointments.  Patient doing well with weight loss in progress. In 2 mos will stop medicine and allow for a drug-free holiday. Patient understands she may need future therapy if weight gain resumes or she does not reach her weight loss goals on her own with good diet and exercise habits.  Follow Up Instructions: For annual   I discussed the assessment and treatment plan with the patient. The patient was provided an opportunity to ask questions and all were answered. The patient agreed with the plan and demonstrated an understanding of the instructions.  The patient was advised to call back or seek an in-person evaluation if the symptoms worsen or if the condition fails to improve as anticipated.  A total of 20 minutes were spent  face-to-face with the patient as well as preparation, review, communication, and documentation during this encounter.   Barnett Applebaum, MD, [facility], [facility] 05/02/2020  11:16 AM

## 2020-06-03 ENCOUNTER — Other Ambulatory Visit: Payer: Self-pay | Admitting: Obstetrics & Gynecology

## 2020-06-03 DIAGNOSIS — E669 Obesity, unspecified: Secondary | ICD-10-CM

## 2021-05-04 ENCOUNTER — Ambulatory Visit: Payer: 59 | Admitting: Obstetrics & Gynecology

## 2021-05-04 ENCOUNTER — Other Ambulatory Visit: Payer: Self-pay | Admitting: Obstetrics & Gynecology

## 2021-05-04 ENCOUNTER — Telehealth: Payer: Self-pay

## 2021-05-04 DIAGNOSIS — Z1231 Encounter for screening mammogram for malignant neoplasm of breast: Secondary | ICD-10-CM

## 2021-05-04 NOTE — Telephone Encounter (Signed)
Pt calling d/t PH's sched being cancelled; is resched to 06/06/21 for annual; needs order for mammogram.  [phone]

## 2021-05-04 NOTE — Telephone Encounter (Signed)
MMG ordered

## 2021-05-05 NOTE — Telephone Encounter (Signed)
Pt aware.

## 2021-05-16 ENCOUNTER — Ambulatory Visit
Admission: RE | Admit: 2021-05-16 | Discharge: 2021-05-16 | Disposition: A | Discharge status: Home / Self Care | Payer: 59 | Source: Ambulatory Visit | Attending: Obstetrics & Gynecology | Admitting: Obstetrics & Gynecology

## 2021-05-16 ENCOUNTER — Other Ambulatory Visit: Payer: Self-pay

## 2021-05-16 DIAGNOSIS — Z1231 Encounter for screening mammogram for malignant neoplasm of breast: Secondary | ICD-10-CM | POA: Diagnosis present

## 2021-06-06 ENCOUNTER — Ambulatory Visit (INDEPENDENT_AMBULATORY_CARE_PROVIDER_SITE_OTHER): Payer: 59 | Admitting: Obstetrics & Gynecology

## 2021-06-06 ENCOUNTER — Other Ambulatory Visit: Payer: Self-pay

## 2021-06-06 ENCOUNTER — Encounter: Payer: Self-pay | Admitting: Obstetrics & Gynecology

## 2021-06-06 VITALS — BP 128/80 | Ht 68.5 in | Wt 223.0 lb

## 2021-06-06 DIAGNOSIS — E669 Obesity, unspecified: Secondary | ICD-10-CM

## 2021-06-06 DIAGNOSIS — Z01419 Encounter for gynecological examination (general) (routine) without abnormal findings: Secondary | ICD-10-CM | POA: Diagnosis not present

## 2021-06-06 MED ORDER — PHENTERMINE HCL 37.5 MG PO TABS
ORAL_TABLET | ORAL | 1 refills | Status: DC
Start: 2021-06-06 — End: 2021-08-07

## 2021-06-06 NOTE — Patient Instructions (Signed)
PAP every three years Mammogram every year    Call [phone] to schedule at [place] Colonoscopy every 10 years Labs yearly (with PCP)  Thank you for choosing [facility]. As part of our ongoing efforts to improve patient experience, we would appreciate your feedback. Please fill out the short survey that you will receive by mail or MyChart. Your opinion is important to us! - Dr. Deniyah Dillavou  Recommendations to boost your immunity to prevent illness such as viral flu and colds, including covid19, are as follows:       - - -  Vitamin K2 and Vitamin D3  - - - Take Vitamin K2 at 200-300 mcg daily (usually 2-3 pills daily of the over the counter formulation). Take Vitamin D3 at 3000-4000 U daily (usually 3-4 pills daily of the over the counter formulation). Studies show that these two at high normal levels in your system are very effective in keeping your immunity so strong and protective that you will be unlikely to contract viral illness such as those listed above.  Dr Ewel Lona  

## 2021-06-06 NOTE — Progress Notes (Signed)
HPI:      [name] is a 50 y.o. [id number] who LMP was No LMP recorded. (Menstrual status: IUD)., she presents today for her annual examination. The patient has no complaints today. The patient is sexually active. Her last pap: approximate date 2020 and was normal and last mammogram: approximate date 2021 and was normal. The patient does perform self breast exams.  There is no notable family history of breast or ovarian cancer in her family.  The patient has regular exercise: yes.  The patient denies current symptoms of depression.    GYN History: Contraception: IUD  PMHx: Past Medical History:  Diagnosis Date   Basal cell carcinoma    shoulder, hip, stomach, left arm, back of right leg   COVID-19    Past Surgical History:  Procedure Laterality Date   BREAST BIOPSY Left 02/02/2019   pending path, [place] bx    CESAREAN SECTION     COLONOSCOPY WITH PROPOFOL N/A 04/22/2020   Procedure: COLONOSCOPY WITH PROPOFOL;  Surgeon: Jonathon Bellows, MD;  Location: [facility] ENDOSCOPY;  Service: Gastroenterology;  Laterality: N/A;   DILATION AND CURETTAGE OF UTERUS  06/11/2013   INTRAUTERINE DEVICE (IUD) INSERTION  02/24/2014   mirena   ROTATOR CUFF REPAIR     Family History  Problem Relation Age of Onset   Breast cancer Maternal Aunt    Leukemia Father    Brain cancer Paternal Aunt    Lung cancer Paternal Aunt    Bone cancer Paternal Aunt    Colon cancer Paternal Uncle    Prostate cancer Paternal Uncle    Heart attack Paternal Uncle    Colon cancer Maternal Grandfather    Social History   Tobacco Use   Smoking status: Never   Smokeless tobacco: Never  Vaping Use   Vaping Use: Never used  Substance Use Topics   Alcohol use: No   Drug use: No    Current Outpatient Medications:    levonorgestrel (MIRENA) 20 MCG/24HR IUD, 1 each by Intrauterine route once., Disp: , Rfl:    phentermine (ADIPEX-P) 37.5 MG tablet, One tablet po in morning., Disp: 30 tablet, Rfl: 1   topiramate (TOPAMAX)  25 MG tablet, Take 1 tablet (25 mg total) by mouth daily. (Patient not taking: Reported on 06/06/2021), Disp: 30 tablet, Rfl: 6 Allergies: No known allergies  Review of Systems  Constitutional:  Negative for chills, fever and malaise/fatigue.  HENT:  Negative for congestion, sinus pain and sore throat.   Eyes:  Negative for blurred vision and pain.  Respiratory:  Negative for cough and wheezing.   Cardiovascular:  Negative for chest pain and leg swelling.  Gastrointestinal:  Negative for abdominal pain, constipation, diarrhea, heartburn, nausea and vomiting.  Genitourinary:  Negative for dysuria, frequency, hematuria and urgency.  Musculoskeletal:  Negative for back pain, joint pain, myalgias and neck pain.  Skin:  Negative for itching and rash.  Neurological:  Negative for dizziness, tremors and weakness.  Endo/Heme/Allergies:  Does not bruise/bleed easily.  Psychiatric/Behavioral:  Negative for depression. The patient is not nervous/anxious and does not have insomnia.    Objective: BP 128/80   Ht 5' 8.5" (1.74 m)   Wt 223 lb (101.2 kg)   BMI 33.41 kg/m   Filed Weights   06/06/21 1522  Weight: 223 lb (101.2 kg)   Body mass index is 33.41 kg/m. Physical Exam Constitutional:      General: She is not in acute distress.    Appearance: She is well-developed.  Genitourinary:     Bladder, rectum and urethral meatus normal.     No lesions in the vagina.     Right Labia: No rash, tenderness or lesions.    Left Labia: No tenderness, lesions or rash.    No vaginal bleeding.      Right Adnexa: not tender and no mass present.    Left Adnexa: not tender and no mass present.    No cervical motion tenderness, friability, lesion or polyp.     IUD strings visualized.     Uterus is not enlarged.     No uterine mass detected.    Pelvic exam was performed with patient in the lithotomy position.  Breasts:    Right: No mass, skin change or tenderness.     Left: No mass, skin change or  tenderness.  HENT:     Head: Normocephalic and atraumatic. No laceration.     Right Ear: Hearing normal.     Left Ear: Hearing normal.     Mouth/Throat:     Pharynx: Uvula midline.  Eyes:     Pupils: Pupils are equal, round, and reactive to light.  Neck:     Thyroid: No thyromegaly.  Cardiovascular:     Rate and Rhythm: Normal rate and regular rhythm.     Heart sounds: No murmur heard.   No friction rub. No gallop.  Pulmonary:     Effort: Pulmonary effort is normal. No respiratory distress.     Breath sounds: Normal breath sounds. No wheezing.  Abdominal:     General: Bowel sounds are normal. There is no distension.     Palpations: Abdomen is soft.     Tenderness: There is no abdominal tenderness. There is no rebound.  Musculoskeletal:        General: Normal range of motion.     Cervical back: Normal range of motion and neck supple.  Neurological:     Mental Status: She is alert and oriented to person, place, and time.     Cranial Nerves: No cranial nerve deficit.  Skin:    General: Skin is warm and dry.  Psychiatric:        Judgment: Judgment normal.  Vitals reviewed.    Assessment:  ANNUAL EXAM 1. Women's annual routine gynecological examination   2. Obesity (BMI 30.0-34.9)      Screening Plan:            1.  Cervical Screening-  Pap smear schedule reviewed with patient, Pap smear to be scheduled  2. Breast screening- Exam annually and mammogram>40 planned   3. Colonoscopy every 10 years, Hemoccult testing - after age 80, done 2021  4. Labs managed by PCP  5. Counseling for contraception: IUD  6. Obesity (BMI 30.0-34.9) Phentermine for 2 mos      F/U  Return in about 2 months (around 08/06/2021) for Follow up Virtual.  Barnett Applebaum, MD, [facility], [facility] 06/06/2021  4:11 PM

## 2021-07-13 ENCOUNTER — Other Ambulatory Visit: Payer: Self-pay | Admitting: Obstetrics & Gynecology

## 2021-08-07 ENCOUNTER — Other Ambulatory Visit: Payer: Self-pay

## 2021-08-07 ENCOUNTER — Encounter: Payer: Self-pay | Admitting: Obstetrics & Gynecology

## 2021-08-07 ENCOUNTER — Ambulatory Visit (INDEPENDENT_AMBULATORY_CARE_PROVIDER_SITE_OTHER): Payer: 59 | Admitting: Obstetrics & Gynecology

## 2021-08-07 DIAGNOSIS — E669 Obesity, unspecified: Secondary | ICD-10-CM

## 2021-08-07 MED ORDER — PHENTERMINE HCL 37.5 MG PO TABS: ORAL_TABLET | ORAL | 1 refills | Status: DC

## 2021-08-07 NOTE — Progress Notes (Signed)
Virtual Visit via Telephone Note  I connected with Mary May on 08/07/21 at 10:40 AM EDT by telephone and verified that I am speaking with the correct person using two identifiers.  Location: Patient: Office at work, Midwife: Office   I discussed the limitations, risks, security and privacy concerns of performing an evaluation and management service by telephone and the availability of in person appointments. I also discussed with the patient that there may be a patient responsible charge related to this service. The patient expressed understanding and agreed to proceed.   History of Present Illness: Mary May is a 50 y.o. who was started on Phentermine approximately 2 months ago due to obesity/abnormal weight gain. The patient has lost 6 pounds over the past 2 mos due to meds and diet..   She has these side effects: none.  PMHx: She  has a past medical history of Basal cell carcinoma and COVID-19. Also,  has a past surgical history that includes Cesarean section; Dilation and curettage of uterus (06/11/2013); Intrauterine device (iud) insertion (02/24/2014); Rotator cuff repair; Breast biopsy (Left, 02/02/2019); and Colonoscopy with propofol (N/A, 04/22/2020)., family history includes Bone cancer in her paternal aunt; Brain cancer in her paternal aunt; Breast cancer in her maternal aunt; Colon cancer in her maternal grandfather and paternal uncle; Heart attack in her paternal uncle; Leukemia in her father; Lung cancer in her paternal aunt; Prostate cancer in her paternal uncle.,  reports that she has never smoked. She has never used smokeless tobacco. She reports that she does not drink alcohol and does not use drugs.  She has a current medication list which includes the following prescription(s): levonorgestrel and phentermine. Also, is allergic to no known allergies.  ROS  Observations/Objective: No exam today, due to telephone eVisit due to [facility] virus restriction on  elective visits and procedures.  Prior visits reviewed along with ultrasounds/labs as indicated. Home weight 216 lbs  Assessment and Plan:   ICD-10-CM   1. Obesity (BMI 30.0-34.9)  E66.9      Follow Up Instructions: Medication treatment is going well for her.  Plan: Patient is continued/added to prescription appetite suppressants: Phentermine. Will consider Ozempic as next level medicine   Will continue to assist patient in incorporating positive experiences into her life to promote a positive mental attitude.  Education given regarding appropriate lifestyle changes for weight loss, including regular physical activity, healthy coping strategies, caloric restriction, and healthy eating patterns.  The risks and benefits as well as side effects of medication, such as Phenteramine or Tenuate, is discussed.  The pros and cons of suppressing appetite and boosting metabolism is counseled.  Risks of tolerance and addiction discussed.  Use of medicine will be short term.  Pt to call with any negative side effects and agrees to keep follow up appointments.  I discussed the assessment and treatment plan with the patient. The patient was provided an opportunity to ask questions and all were answered. The patient agreed with the plan and demonstrated an understanding of the instructions.   The patient was advised to call back or seek an in-person evaluation if the symptoms worsen or if the condition fails to improve as anticipated.  I provided 16 minutes of non-face-to-face time during this encounter.   Hoyt Koch, MD

## 2021-10-16 ENCOUNTER — Ambulatory Visit (INDEPENDENT_AMBULATORY_CARE_PROVIDER_SITE_OTHER): Payer: 59 | Admitting: Obstetrics & Gynecology

## 2021-10-16 ENCOUNTER — Encounter: Payer: Self-pay | Admitting: Obstetrics & Gynecology

## 2021-10-16 ENCOUNTER — Other Ambulatory Visit: Payer: Self-pay

## 2021-10-16 DIAGNOSIS — E669 Obesity, unspecified: Secondary | ICD-10-CM | POA: Diagnosis not present

## 2021-10-16 MED ORDER — OZEMPIC (0.25 OR 0.5 MG/DOSE) 2 MG/1.5ML ~~LOC~~ SOPN: 0.5000 mg | PEN_INJECTOR | SUBCUTANEOUS | 6 refills | Status: DC

## 2021-10-16 NOTE — Patient Instructions (Signed)
Semaglutide Injection °What is this medication? °SEMAGLUTIDE (SEM a GLOO tide) treats type 2 diabetes. It works by increasing insulin levels in your body, which decreases your blood sugar (glucose). It also reduces the amount of sugar released into the blood and slows down your digestion. It can also be used to lower the risk of heart attack and stroke in people with type 2 diabetes. Changes to diet and exercise are often combined with this medication. °This medicine may be used for other purposes; ask your health care provider or pharmacist if you have questions. °COMMON BRAND NAME(S): OZEMPIC °What should I tell my care team before I take this medication? °They need to know if you have any of these conditions: °Endocrine tumors (MEN 2) or if someone in your family had these tumors °Eye disease, vision problems °History of pancreatitis °Kidney disease °Stomach problems °Thyroid cancer or if someone in your family had thyroid cancer °An unusual or allergic reaction to semaglutide, other medications, foods, dyes, or preservatives °Pregnant or trying to get pregnant °Breast-feeding °How should I use this medication? °This medication is for injection under the skin of your upper leg (thigh), stomach area, or upper arm. It is given once every week (every 7 days). You will be taught how to prepare and give this medication. Use exactly as directed. Take your medication at regular intervals. Do not take it more often than directed. °If you use this medication with insulin, you should inject this medication and the insulin separately. Do not mix them together. Do not give the injections right next to each other. Change (rotate) injection sites with each injection. °It is important that you put your used needles and syringes in a special sharps container. Do not put them in a trash can. If you do not have a sharps container, call your pharmacist or care team to get one. °A special MedGuide will be given to you by the  pharmacist with each prescription and refill. Be sure to read this information carefully each time. °This medication comes with INSTRUCTIONS FOR USE. Ask your pharmacist for directions on how to use this medication. Read the information carefully. Talk to your pharmacist or care team if you have questions. °Talk to your care team about the use of this medication in children. Special care may be needed. °Overdosage: If you think you have taken too much of this medicine contact a poison control center or emergency room at once. °NOTE: This medicine is only for you. Do not share this medicine with others. °What if I miss a dose? °If you miss a dose, take it as soon as you can within 5 days after the missed dose. Then take your next dose at your regular weekly time. If it has been longer than 5 days after the missed dose, do not take the missed dose. Take the next dose at your regular time. Do not take double or extra doses. If you have questions about a missed dose, contact your care team for advice. °What may interact with this medication? °Other medications for diabetes °Many medications may cause changes in blood sugar, these include: °Alcohol containing beverages °Antiviral medications for HIV or AIDS °Aspirin and aspirin-like medications °Certain medications for blood pressure, heart disease, irregular heart beat °Chromium °Diuretics °Female hormones, such as estrogens or progestins, birth control pills °Fenofibrate °Gemfibrozil °Isoniazid °Lanreotide °Female hormones or anabolic steroids °MAOIs like Carbex, Eldepryl, Marplan, Nardil, and Parnate °Medications for weight loss °Medications for allergies, asthma, cold, or cough °Medications for depression,   anxiety, or psychotic disturbances °Niacin °Nicotine °NSAIDs, medications for pain and inflammation, like ibuprofen or naproxen °Octreotide °Pasireotide °Pentamidine °Phenytoin °Probenecid °Quinolone antibiotics such as ciprofloxacin, levofloxacin, ofloxacin °Some  herbal dietary supplements °Steroid medications such as prednisone or cortisone °Sulfamethoxazole; trimethoprim °Thyroid hormones °Some medications can hide the warning symptoms of low blood sugar (hypoglycemia). You may need to monitor your blood sugar more closely if you are taking one of these medications. These include: °Beta-blockers, often used for high blood pressure or heart problems (examples include atenolol, metoprolol, propranolol) °Clonidine °Guanethidine °Reserpine °This list may not describe all possible interactions. Give your health care provider a list of all the medicines, herbs, non-prescription drugs, or dietary supplements you use. Also tell them if you smoke, drink alcohol, or use illegal drugs. Some items may interact with your medicine. °What should I watch for while using this medication? °Visit your care team for regular checks on your progress. °Drink plenty of fluids while taking this medication. Check with your care team if you get an attack of severe diarrhea, nausea, and vomiting. The loss of too much body fluid can make it dangerous for you to take this medication. °A test called the HbA1C (A1C) will be monitored. This is a simple blood test. It measures your blood sugar control over the last 2 to 3 months. You will receive this test every 3 to 6 months. °Learn how to check your blood sugar. Learn the symptoms of low and high blood sugar and how to manage them. °Always carry a quick-source of sugar with you in case you have symptoms of low blood sugar. Examples include hard sugar candy or glucose tablets. Make sure others know that you can choke if you eat or drink when you develop serious symptoms of low blood sugar, such as seizures or unconsciousness. They must get medical help at once. °Tell your care team if you have high blood sugar. You might need to change the dose of your medication. If you are sick or exercising more than usual, you might need to change the dose of your  medication. °Do not skip meals. Ask your care team if you should avoid alcohol. Many nonprescription cough and cold products contain sugar or alcohol. These can affect blood sugar. °Pens should never be shared. Even if the needle is changed, sharing may result in passing of viruses like hepatitis or HIV. °Wear a medical ID bracelet or chain, and carry a card that describes your disease and details of your medication and dosage times. °Do not become pregnant while taking this medication. Women should inform their care team if they wish to become pregnant or think they might be pregnant. There is a potential for serious side effects to an unborn child. Talk to your care team for more information. °What side effects may I notice from receiving this medication? °Side effects that you should report to your care team as soon as possible: °Allergic reactions--skin rash, itching, hives, swelling of the face, lips, tongue, or throat °Change in vision °Dehydration--increased thirst, dry mouth, feeling faint or lightheaded, headache, dark yellow or brown urine °Gallbladder problems--severe stomach pain, nausea, vomiting, fever °Heart palpitations--rapid, pounding, or irregular heartbeat °Kidney injury--decrease in the amount of urine, swelling of the ankles, hands, or feet °Pancreatitis--severe stomach pain that spreads to your back or gets worse after eating or when touched, fever, nausea, vomiting °Thyroid cancer--new mass or lump in the neck, pain or trouble swallowing, trouble breathing, hoarseness °Side effects that usually do not require medical   attention (report to your care team if they continue or are bothersome): °Diarrhea °Loss of appetite °Nausea °Stomach pain °Vomiting °This list may not describe all possible side effects. Call your doctor for medical advice about side effects. You may report side effects to FDA at 1-800-FDA-1088. °Where should I keep my medication? °Keep out of the reach of children. °Store  unopened pens in a refrigerator between 2 and 8 degrees C (36 and 46 degrees F). Do not freeze. Protect from light and heat. After you first use the pen, it can be stored for 56 days at room temperature between 15 and 30 degrees C (59 and 86 degrees F) or in a refrigerator. Throw away your used pen after 56 days or after the expiration date, whichever comes first. °Do not store your pen with the needle attached. If the needle is left on, medication may leak from the pen. °NOTE: This sheet is a summary. It may not cover all possible information. If you have questions about this medicine, talk to your doctor, pharmacist, or health care provider. °© 2022 [employer] (2021-01-26 00:00:00) ° °

## 2021-10-16 NOTE — Progress Notes (Signed)
Virtual Visit via Telephone Note  I connected with Mary May on 10/16/21 at  9:40 AM EST by telephone and verified that I am speaking with the correct person using two identifiers.  Location: Patient: Work, Health and safety inspector: Office   I discussed the limitations, risks, security and privacy concerns of performing an evaluation and management service by telephone and the availability of in person appointments. I also discussed with the patient that there may be a patient responsible charge related to this service. The patient expressed understanding and agreed to proceed.   History of Present Illness:  Mary May is a 50 y.o. who was started on Phentermine approximately 2 mos ago due to obesity/abnormal weight gain. The patient reports no significant weight change..   She has these side effects: none.  PMHx: She  has a past medical history of Basal cell carcinoma and COVID-19. Also,  has a past surgical history that includes Cesarean section; Dilation and curettage of uterus (06/11/2013); Intrauterine device (iud) insertion (02/24/2014); Rotator cuff repair; Breast biopsy (Left, 02/02/2019); and Colonoscopy with propofol (N/A, 04/22/2020)., family history includes Bone cancer in her paternal aunt; Brain cancer in her paternal aunt; Breast cancer in her maternal aunt; Colon cancer in her maternal grandfather and paternal uncle; Heart attack in her paternal uncle; Leukemia in her father; Lung cancer in her paternal aunt; Prostate cancer in her paternal uncle.,  reports that she has never smoked. She has never used smokeless tobacco. She reports that she does not drink alcohol and does not use drugs.  She has a current medication list which includes the following prescription(s): ozempic (0.25 or 0.5 mg/dose), levonorgestrel, and phentermine. Also, is allergic to no known allergies.  Review of Systems  All other systems reviewed and are negative.   Observations/Objective: No exam  today, due to telephone eVisit due to [facility] virus restriction on elective visits and procedures.  Prior visits reviewed along with ultrasounds/labs as indicated. Home weight check: 215 lbs  Assessment and Plan:   ICD-10-CM   1. Obesity (BMI 30.0-34.9)  E66.9      Plan: Patient is changed to [place].  She is not losing much more weight on Phentermine  Will continue to assist patient in incorporating positive experiences into her life to promote a positive mental attitude.  Education given regarding appropriate lifestyle changes for weight loss, including regular physical activity, healthy coping strategies, caloric restriction, and healthy eating patterns.  The risks and benefits as well as side effects of medication, such as Ozempic, Phenteramine or Tenuate, is discussed.  The pros and cons of suppressing appetite and boosting metabolism is counseled.  Risks of tolerance and addiction discussed.  Use of medicine will be short term.  Pt to call with any negative side effects and agrees to keep follow up appointments.  Follow Up Instructions: 3 mos   I discussed the assessment and treatment plan with the patient. The patient was provided an opportunity to ask questions and all were answered. The patient agreed with the plan and demonstrated an understanding of the instructions.   The patient was advised to call back or seek an in-person evaluation if the symptoms worsen or if the condition fails to improve as anticipated.  I provided 15 minutes of non-face-to-face time during this encounter.   Hoyt Koch, MD

## 2021-11-23 IMAGING — MG DIGITAL SCREENING BILAT W/ TOMO W/ CAD
8 series · 8 of 24 positions shown · non-contrast
Comparison: Previous exam(s).

CLINICAL DATA: Screening.

EXAM:
DIGITAL SCREENING BILATERAL MAMMOGRAM WITH TOMO AND CAD

[R CC synth-2D]
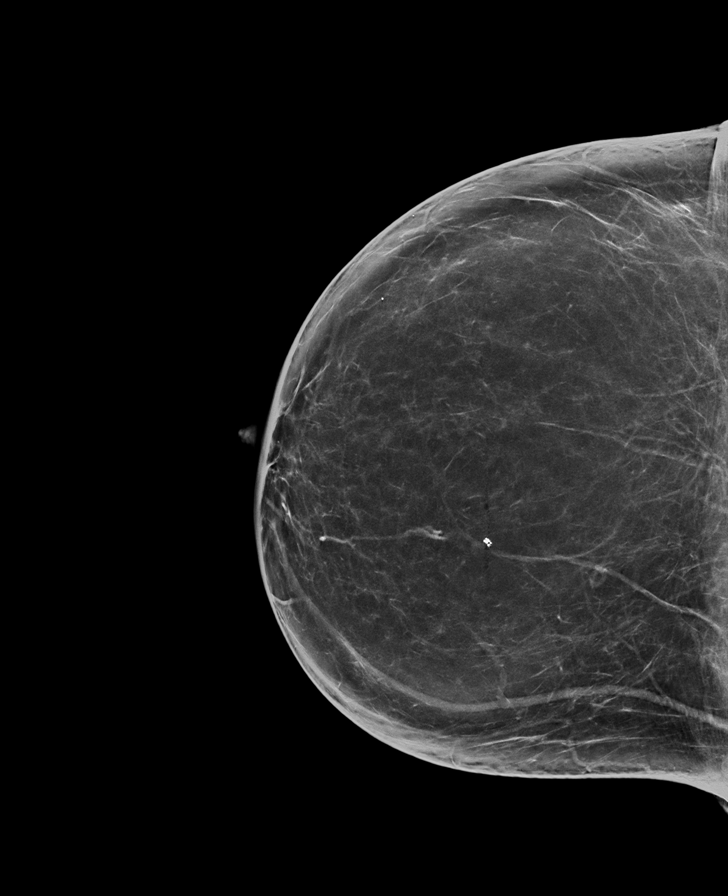

[L CC synth-2D]
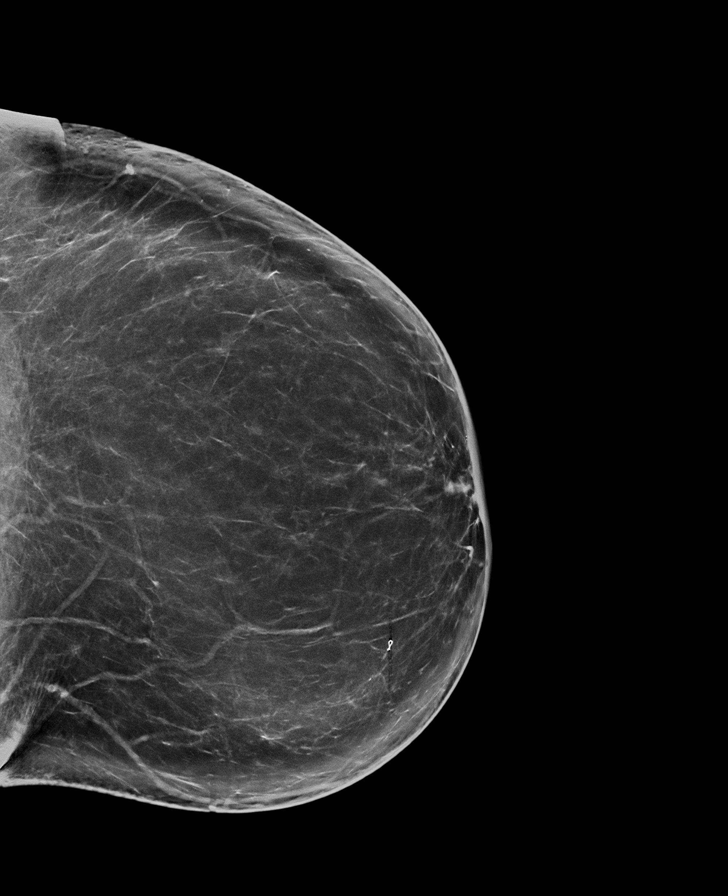

[R MLO synth-2D]
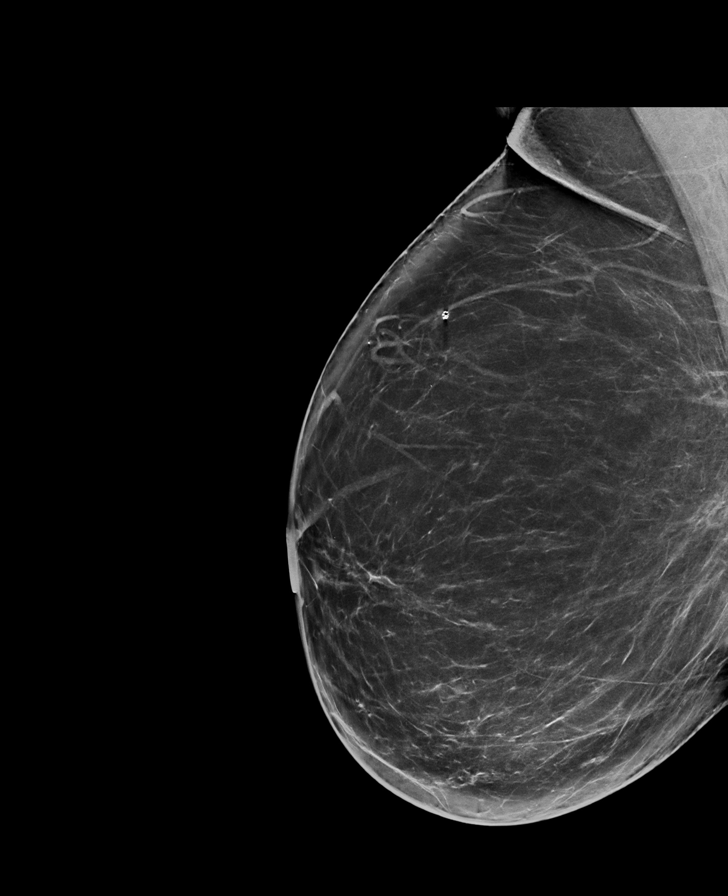

[L MLO synth-2D]
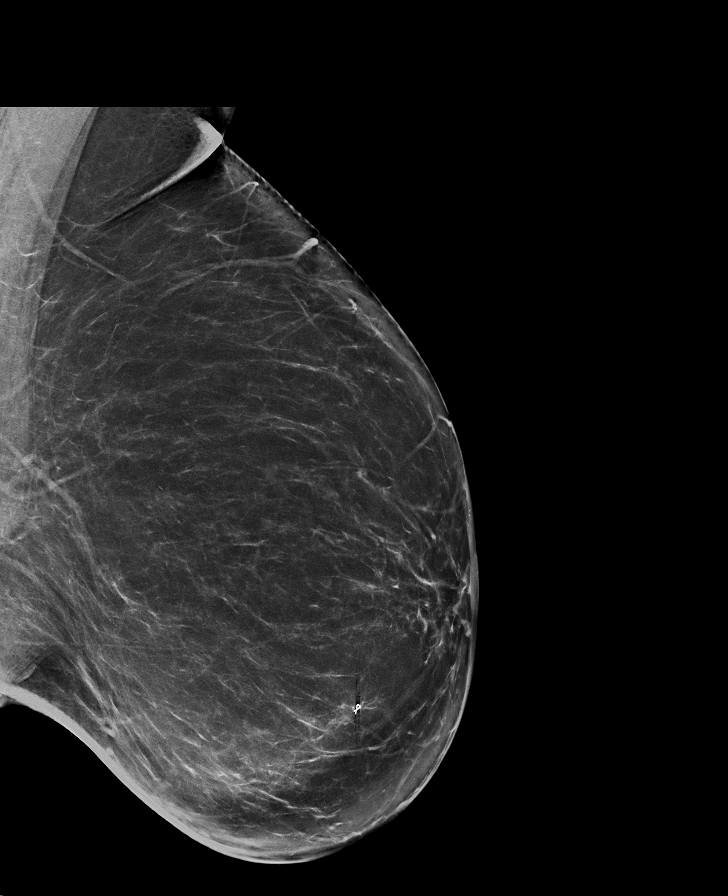

[R MLO tomo · tomo slice 45/90.0]
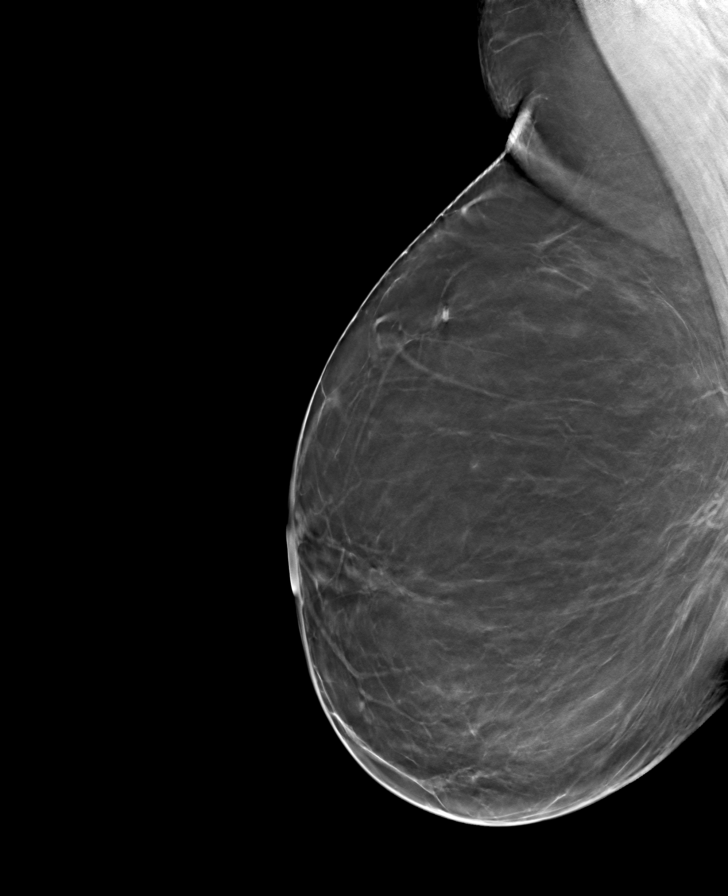

[R CC tomo · tomo slice 43/85.0]
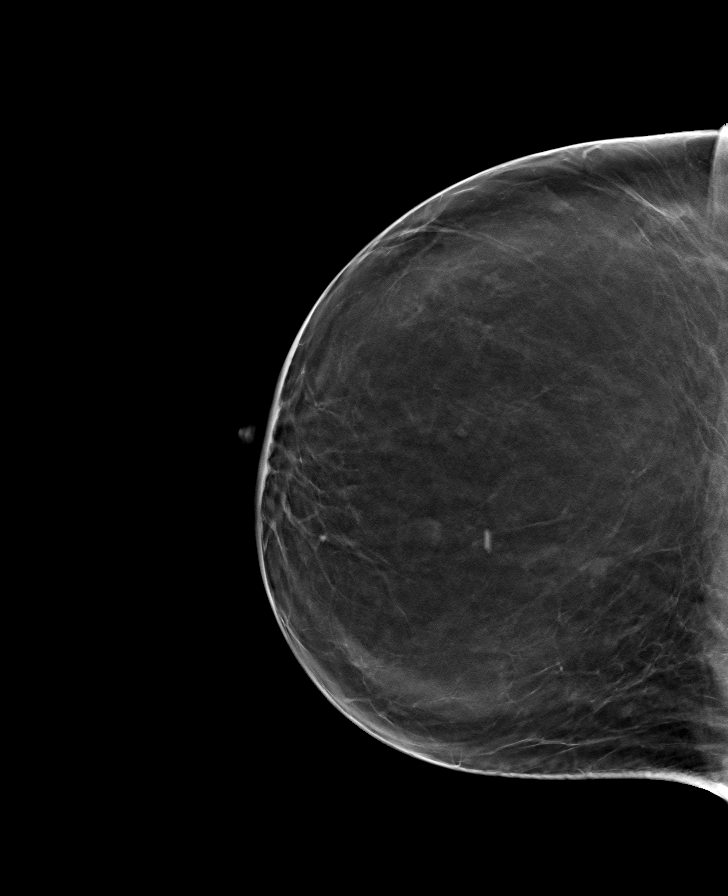

[L MLO tomo · tomo slice 46/91.0]
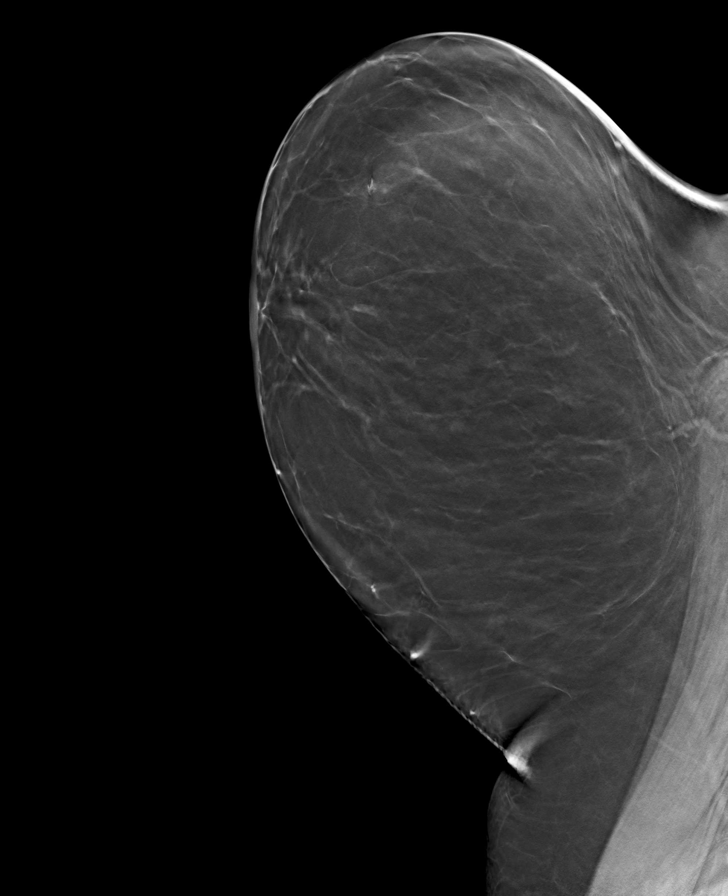

[L CC tomo · tomo slice 45/89.0]
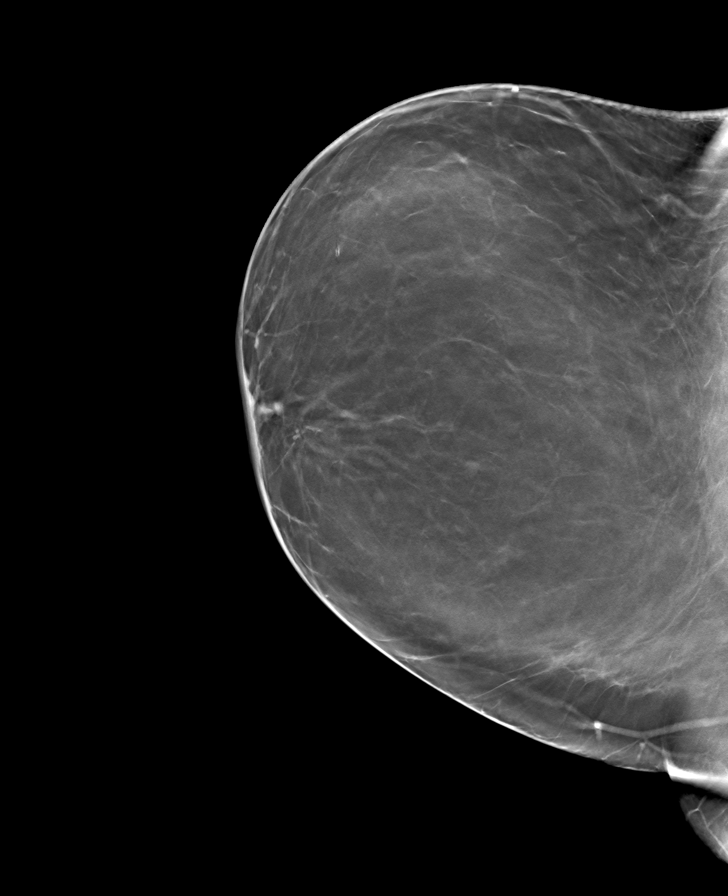

[8 of 24 positions shown; findings below may reference images not displayed]

ACR Breast Density Category b: There are scattered areas of
fibroglandular density.
FINDINGS: There are no findings suspicious for malignancy. Images were
processed with CAD.
IMPRESSION: No mammographic evidence of malignancy. A result letter of this
screening mammogram will be mailed directly to the patient.

RECOMMENDATION:
Screening mammogram in one year. (Code:[id number])

BI-RADS CATEGORY  1: Negative.

## 2022-01-15 ENCOUNTER — Encounter: Payer: Self-pay | Admitting: Obstetrics & Gynecology

## 2022-01-15 ENCOUNTER — Ambulatory Visit (INDEPENDENT_AMBULATORY_CARE_PROVIDER_SITE_OTHER): Payer: 59 | Admitting: Obstetrics & Gynecology

## 2022-01-15 ENCOUNTER — Other Ambulatory Visit: Payer: Self-pay

## 2022-01-15 VITALS — Ht 68.5 in | Wt 222.0 lb

## 2022-01-15 DIAGNOSIS — E669 Obesity, unspecified: Secondary | ICD-10-CM

## 2022-01-15 MED ORDER — PHENTERMINE HCL 37.5 MG PO TABS: ORAL_TABLET | ORAL | 1 refills | Status: DC

## 2022-01-15 NOTE — Progress Notes (Signed)
Virtual Visit via Telephone Note ? ?I connected with Mary May on 01/15/22 at 10:55 AM EDT by telephone and verified that I am speaking with the correct person using two identifiers. ? ?Location: ?Patient: Home ?Provider: Office ?  ?I discussed the limitations, risks, security and privacy concerns of performing an evaluation and management service by telephone and the availability of in person appointments. I also discussed with the patient that there may be a patient responsible charge related to this service. The patient expressed understanding and agreed to proceed. ? ?History of Present Illness: ?  Mary May is a 51 y.o. who was started on Ozempic approximately 3 months ago due to obesity/abnormal weight gain. The patient reports no significant weight change..   She has these side effects: none. ? ?PMHx: ?She  has a past medical history of Basal cell carcinoma and COVID-19. Also,  has a past surgical history that includes Cesarean section; Dilation and curettage of uterus (06/11/2013); Intrauterine device (iud) insertion (02/24/2014); Rotator cuff repair; Breast biopsy (Left, 02/02/2019); and Colonoscopy with propofol (N/A, 04/22/2020)., family history includes Bone cancer in her paternal aunt; Brain cancer in her paternal aunt; Breast cancer in her maternal aunt; Colon cancer in her maternal grandfather and paternal uncle; Heart attack in her paternal uncle; Leukemia in her father; Lung cancer in her paternal aunt; Prostate cancer in her paternal uncle.,  reports that she has never smoked. She has never used smokeless tobacco. She reports that she does not drink alcohol and does not use drugs. ? ?She has a current medication list which includes the following prescription(s): levonorgestrel and phentermine. Also, is allergic to no known allergies. ? ?Review of Systems  ?All other systems reviewed and are negative. ?  ?Observations/Objective: ?No exam today, due to telephone eVisit due to [facility] virus  restriction on elective visits and procedures.  Prior visits reviewed along with ultrasounds/labs as indicated. ?Ht 5' 8.5" (1.74 m)   Wt 222 lb (100.7 kg)   BMI 33.26 kg/m?  ? ?Assessment and Plan: ?1. Obesity (BMI 30.0-34.9) ?Restart Phentermine ?No Ozempic as no response ? ?Follow Up Instructions: ?2 mos/PRN ?  ?I discussed the assessment and treatment plan with the patient. The patient was provided an opportunity to ask questions and all were answered. The patient agreed with the plan and demonstrated an understanding of the instructions. ?  ?The patient was advised to call back or seek an in-person evaluation if the symptoms worsen or if the condition fails to improve as anticipated. ? ?I provided 15 minutes of non-face-to-face time during this encounter. ? ? ?Hoyt Koch, MD ? ? ?

## 2022-08-10 ENCOUNTER — Telehealth: Payer: Self-pay

## 2022-08-10 DIAGNOSIS — Z1231 Encounter for screening mammogram for malignant neoplasm of breast: Secondary | ICD-10-CM

## 2022-08-10 DIAGNOSIS — Z803 Family history of malignant neoplasm of breast: Secondary | ICD-10-CM

## 2022-08-10 NOTE — Telephone Encounter (Signed)
Pt seen Mary May for annual back in March, she needs a mammo order put in for [place]. Can you place order?

## 2022-08-11 NOTE — Telephone Encounter (Signed)
Pt's annual due 8/23. Saw him for obesity in March. Needs annual appt. Will place mammo order.

## 2022-08-13 NOTE — Telephone Encounter (Signed)
Pt aware.

## 2022-09-26 ENCOUNTER — Ambulatory Visit
Admission: RE | Admit: 2022-09-26 | Discharge: 2022-09-26 | Disposition: A | Discharge status: Home / Self Care | Payer: 59 | Source: Ambulatory Visit | Attending: Obstetrics and Gynecology | Admitting: Obstetrics and Gynecology

## 2022-09-26 DIAGNOSIS — Z1231 Encounter for screening mammogram for malignant neoplasm of breast: Secondary | ICD-10-CM | POA: Diagnosis present

## 2023-03-31 NOTE — Progress Notes (Unsigned)
PCP: Patient, No Pcp Per   No chief complaint on file.   HPI:      [name] is a 52 y.o. [id number] whose LMP was No LMP recorded. (Menstrual status: IUD)., presents today for her annual examination.  Her menses are {norm/abn:715}, lasting {number: 22536} days.  Dysmenorrhea {dysmen:716}. She {does:18564} have intermenstrual bleeding. She {does:18564} have vasomotor sx.   Sex activity: {sex active: 315163}. She {does:18564} have vaginal dryness. Mirena IUD placed 02/24/14  Last Pap: 02/02/20 Results were: no abnormalities /neg HPV DNA.  Hx of STDs: {STD hx:14358}  Last mammogram: 09/26/22 Results were: normal--routine follow-up in 12 months There is a FH of breast cancerin her mat aunt***. There is no FH of ovarian cancer. The patient {does:18564} do self-breast exams.  Colonoscopy: 6/21 at [facility] with polyp; Repeat due after 5 years.   Tobacco use: {tob:20664} Alcohol use: {Alcohol:11675} No drug use Exercise: {exercise:31265}  She {does:18564} get adequate calcium and Vitamin D in her diet.  Labs with PCP.   Patient Active Problem List   Diagnosis Date Noted   Obesity (BMI 30.0-34.9) 12/24/2018   Pain in right knee 12/10/2017    Past Surgical History:  Procedure Laterality Date   BREAST BIOPSY Left 02/02/2019   pending path, [place] bx    CESAREAN SECTION     COLONOSCOPY WITH PROPOFOL N/A 04/22/2020   Procedure: COLONOSCOPY WITH PROPOFOL;  Surgeon: Wyline Mood, MD;  Location: [facility] ENDOSCOPY;  Service: Gastroenterology;  Laterality: N/A;   DILATION AND CURETTAGE OF UTERUS  06/11/2013   INTRAUTERINE DEVICE (IUD) INSERTION  02/24/2014   mirena   ROTATOR CUFF REPAIR      Family History  Problem Relation Age of Onset   Breast cancer Maternal Aunt    Leukemia Father    Brain cancer Paternal Aunt    Lung cancer Paternal Aunt    Bone cancer Paternal Aunt    Colon cancer Paternal Uncle    Prostate cancer Paternal Uncle    Heart attack Paternal Uncle     Colon cancer Maternal Grandfather     Social History   Socioeconomic History   Marital status: Married    Spouse name: Not on file   Number of children: Not on file   Years of education: Not on file   Highest education level: Not on file  Occupational History   Not on file  Tobacco Use   Smoking status: Never   Smokeless tobacco: Never  Vaping Use   Vaping Use: Never used  Substance and Sexual Activity   Alcohol use: No   Drug use: No   Sexual activity: Yes    Birth control/protection: I.U.D.  Other Topics Concern   Not on file  Social History Narrative   Not on file   Social Determinants of Health   Financial Resource Strain: Not on file  Food Insecurity: Not on file  Transportation Needs: Not on file  Physical Activity: Insufficiently Active (10/18/2017)   Exercise Vital Sign    Days of Exercise per Week: 5 days    Minutes of Exercise per Session: 20 min  Stress: No Stress Concern Present (10/18/2017)   [employer] of Occupational Health - Occupational Stress Questionnaire    Feeling of Stress : Only a little  Social Connections: Socially Integrated (10/18/2017)   Social Connection and Isolation Panel [NHANES]    Frequency of Communication with Friends and Family: More than three times a week    Frequency of Social Gatherings with  Friends and Family: More than three times a week    Attends Religious Services: More than 4 times per year    Active Member of Clubs or Organizations: Yes    Attends Engineer, structural: More than 4 times per year    Marital Status: Married  Catering manager Violence: Not At Risk (10/18/2017)   Humiliation, Afraid, Rape, and Kick questionnaire    Fear of Current or Ex-Partner: No    Emotionally Abused: No    Physically Abused: No    Sexually Abused: No     Current Outpatient Medications:    levonorgestrel (MIRENA) 20 MCG/24HR IUD, 1 each by Intrauterine route once., Disp: , Rfl:    phentermine (ADIPEX-P) 37.5 MG  tablet, One tablet po in morning., Disp: 30 tablet, Rfl: 1     ROS:  Review of Systems BREAST: No symptoms    Objective: There were no vitals taken for this visit.   OBGyn Exam  Results: No results found for this or any previous visit (from the past 24 hour(s)).  Assessment/Plan:  No diagnosis found.   No orders of the defined types were placed in this encounter.           GYN counsel {counseling: 16159}    F/U  No follow-ups on file.  Alvera Tourigny B. Alleya Demeter, PA-C 03/31/2023 4:37 PM

## 2023-04-02 ENCOUNTER — Ambulatory Visit (INDEPENDENT_AMBULATORY_CARE_PROVIDER_SITE_OTHER): Payer: 59 | Admitting: Obstetrics and Gynecology

## 2023-04-02 ENCOUNTER — Other Ambulatory Visit (HOSPITAL_COMMUNITY)
Admission: RE | Admit: 2023-04-02 | Discharge: 2023-04-02 | Disposition: A | Discharge status: Home / Self Care | Payer: 59 | Source: Ambulatory Visit | Attending: Obstetrics and Gynecology | Admitting: Obstetrics and Gynecology

## 2023-04-02 ENCOUNTER — Encounter: Payer: Self-pay | Admitting: Obstetrics and Gynecology

## 2023-04-02 VITALS — BP 140/88 | Ht 68.0 in | Wt 223.0 lb

## 2023-04-02 DIAGNOSIS — Z1151 Encounter for screening for human papillomavirus (HPV): Secondary | ICD-10-CM | POA: Insufficient documentation

## 2023-04-02 DIAGNOSIS — Z6833 Body mass index (BMI) 33.0-33.9, adult: Secondary | ICD-10-CM

## 2023-04-02 DIAGNOSIS — Z131 Encounter for screening for diabetes mellitus: Secondary | ICD-10-CM

## 2023-04-02 DIAGNOSIS — Z124 Encounter for screening for malignant neoplasm of cervix: Secondary | ICD-10-CM

## 2023-04-02 DIAGNOSIS — Z0000 Encounter for general adult medical examination without abnormal findings: Secondary | ICD-10-CM

## 2023-04-02 DIAGNOSIS — Z713 Dietary counseling and surveillance: Secondary | ICD-10-CM

## 2023-04-02 DIAGNOSIS — Z01419 Encounter for gynecological examination (general) (routine) without abnormal findings: Secondary | ICD-10-CM

## 2023-04-02 DIAGNOSIS — Z30431 Encounter for routine checking of intrauterine contraceptive device: Secondary | ICD-10-CM

## 2023-04-02 DIAGNOSIS — Z1231 Encounter for screening mammogram for malignant neoplasm of breast: Secondary | ICD-10-CM

## 2023-04-02 DIAGNOSIS — Z803 Family history of malignant neoplasm of breast: Secondary | ICD-10-CM

## 2023-04-02 DIAGNOSIS — Z13220 Encounter for screening for lipoid disorders: Secondary | ICD-10-CM

## 2023-04-02 MED ORDER — PHENTERMINE HCL 37.5 MG PO CAPS: 37.5000 mg | ORAL_CAPSULE | Freq: Every morning | ORAL | 1 refills | Status: AC

## 2023-04-02 NOTE — Patient Instructions (Addendum)
I value your feedback and you entrusting us with your care. If you get a [place] patient survey, I would appreciate you taking the time to let us know about your experience today. Thank you!  [facility] ([place])--[phone]  

## 2023-04-03 LAB — COMPREHENSIVE METABOLIC PANEL
ALT: 11 IU/L (ref 0–32)
AST: 14 IU/L (ref 0–40)
Albumin/Globulin Ratio: 2.1 (ref 1.2–2.2)
Albumin: 4.9 g/dL (ref 3.8–4.9)
Alkaline Phosphatase: 86 IU/L (ref 44–121)
BUN/Creatinine Ratio: 15 (ref 9–23)
BUN: 11 mg/dL (ref 6–24)
Bilirubin Total: 0.4 mg/dL (ref 0.0–1.2)
CO2: 21 mmol/L (ref 20–29)
Calcium: 9.9 mg/dL (ref 8.7–10.2)
Chloride: 100 mmol/L (ref 96–106)
Creatinine, Ser: 0.73 mg/dL (ref 0.57–1.00)
Globulin, Total: 2.3 g/dL (ref 1.5–4.5)
Glucose: 80 mg/dL (ref 70–99)
Potassium: 3.7 mmol/L (ref 3.5–5.2)
Sodium: 140 mmol/L (ref 134–144)
Total Protein: 7.2 g/dL (ref 6.0–8.5)
eGFR: 98 mL/min/{1.73_m2} (ref >59)

## 2023-04-03 LAB — HEMOGLOBIN A1C
Est. average glucose Bld gHb Est-mCnc: 120 mg/dL
Hgb A1c MFr Bld: 5.8 % — ABNORMAL HIGH (ref 4.8–5.6)

## 2023-04-03 LAB — LIPID PANEL
Chol/HDL Ratio: 4.3 ratio (ref 0.0–4.4)
Cholesterol, Total: 238 mg/dL — ABNORMAL HIGH (ref 100–199)
HDL: 56 mg/dL (ref >39)
LDL Chol Calc (NIH): 171 mg/dL — ABNORMAL HIGH (ref 0–99)
Triglycerides: 65 mg/dL (ref 0–149)
VLDL Cholesterol Cal: 11 mg/dL (ref 5–40)

## 2023-04-05 LAB — CYTOLOGY - PAP
Adequacy: ABSENT
Comment: NEGATIVE
Diagnosis: NEGATIVE
High risk HPV: NEGATIVE

## 2023-05-27 ENCOUNTER — Other Ambulatory Visit: Payer: Self-pay | Admitting: Obstetrics and Gynecology

## 2023-05-27 DIAGNOSIS — Z713 Dietary counseling and surveillance: Secondary | ICD-10-CM

## 2023-11-22 ENCOUNTER — Ambulatory Visit
Admission: RE | Admit: 2023-11-22 | Discharge: 2023-11-22 | Disposition: A | Discharge status: Home / Self Care | Payer: 59 | Source: Ambulatory Visit | Attending: Obstetrics and Gynecology | Admitting: Obstetrics and Gynecology

## 2023-11-22 DIAGNOSIS — Z803 Family history of malignant neoplasm of breast: Secondary | ICD-10-CM | POA: Diagnosis present

## 2023-11-22 DIAGNOSIS — Z1231 Encounter for screening mammogram for malignant neoplasm of breast: Secondary | ICD-10-CM | POA: Diagnosis present

## 2023-11-26 ENCOUNTER — Encounter: Payer: Self-pay | Admitting: Obstetrics and Gynecology
